# Patient Record
Sex: Female | Born: 1935 | Race: White | Hispanic: No | Marital: Married | Smoking: Never smoker
Health system: Southern US, Community
[De-identification: ages and names within clinical notes are randomized; demographics above are authoritative.]

## PROBLEM LIST (undated history)

## (undated) DIAGNOSIS — IMO0002 Reserved for concepts with insufficient information to code with codable children: Secondary | ICD-10-CM

## (undated) DIAGNOSIS — I10 Essential (primary) hypertension: Secondary | ICD-10-CM

## (undated) DIAGNOSIS — C801 Malignant (primary) neoplasm, unspecified: Secondary | ICD-10-CM

## (undated) HISTORY — PX: MASTECTOMY: SHX3

## (undated) HISTORY — PX: HIP ARTHROPLASTY: SHX981

## (undated) HISTORY — PX: CHOLECYSTECTOMY: SHX55

## (undated) HISTORY — PX: ABDOMINAL HYSTERECTOMY: SHX81

---

## 2007-12-02 ENCOUNTER — Inpatient Hospital Stay (HOSPITAL_COMMUNITY): Admission: EM | Admit: 2007-12-02 | Discharge: 2007-12-06 | Payer: Self-pay | Admitting: Emergency Medicine

## 2007-12-03 ENCOUNTER — Ambulatory Visit: Payer: Self-pay | Admitting: Physical Medicine & Rehabilitation

## 2007-12-06 ENCOUNTER — Inpatient Hospital Stay: Admission: AD | Admit: 2007-12-06 | Discharge: 2008-02-12 | Payer: Self-pay | Admitting: Internal Medicine

## 2007-12-17 ENCOUNTER — Ambulatory Visit (HOSPITAL_COMMUNITY): Admission: RE | Admit: 2007-12-17 | Discharge: 2007-12-17 | Payer: Self-pay | Admitting: Internal Medicine

## 2008-03-10 ENCOUNTER — Encounter (HOSPITAL_COMMUNITY): Admission: RE | Admit: 2008-03-10 | Discharge: 2008-03-24 | Payer: Self-pay | Admitting: Oncology

## 2008-03-10 ENCOUNTER — Ambulatory Visit (HOSPITAL_COMMUNITY): Payer: Self-pay | Admitting: Family Medicine

## 2008-10-17 ENCOUNTER — Encounter (HOSPITAL_COMMUNITY): Admission: RE | Admit: 2008-10-17 | Discharge: 2008-11-16 | Payer: Self-pay | Admitting: Oncology

## 2010-02-18 IMAGING — CT CT CHEST W/O CM
1 series · 15 of 31 positions shown, 19 images · non-contrast
Comparison: None
Correlation:  Radionuclide bone scan 10/17/2008, chest radiographs
10/17/2008

CLINICAL DATA: Abnormal bone scan showing abnormal uptake at
sternum and medial right clavicle near sternoclavicular joint

CT CHEST WITHOUT CONTRAST
TECHNIQUE: Multidetector CT imaging of the chest was performed
following the standard protocol without IV contrast.

[Series 2: chestroutine 5.0 b40f · axial · 0.56mm/px · z∈[+900,+1150]mm · 15 of 56 slices shown, 19 images]
[im 3/56  mediastinal]
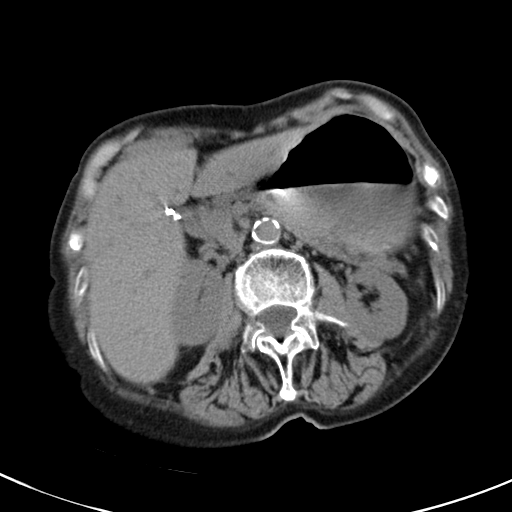
[im 3/56  lung]
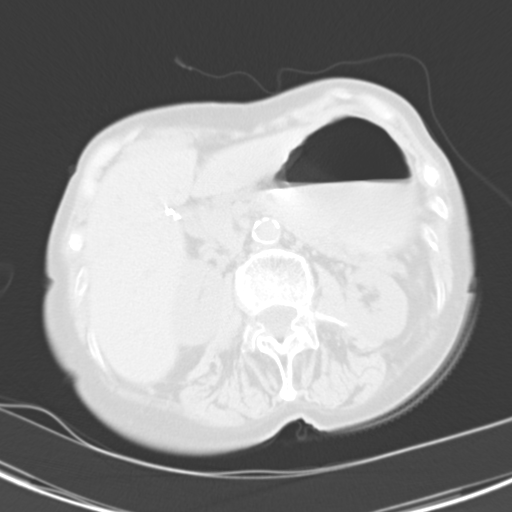
[im 7/56  lung]
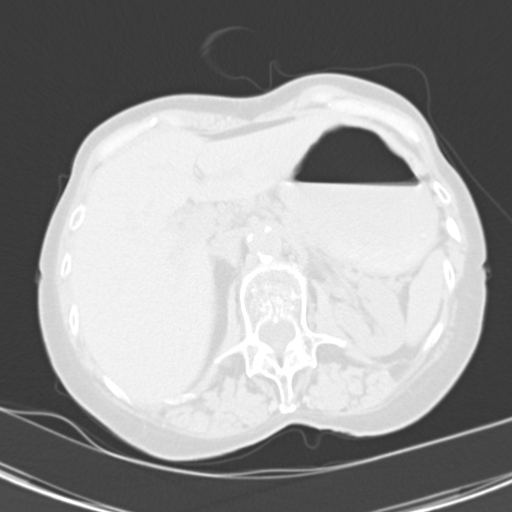
[im 11/56  lung]
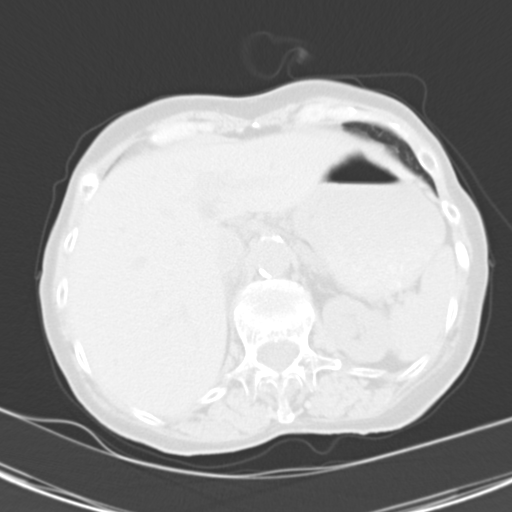
[im 13/56  lung]
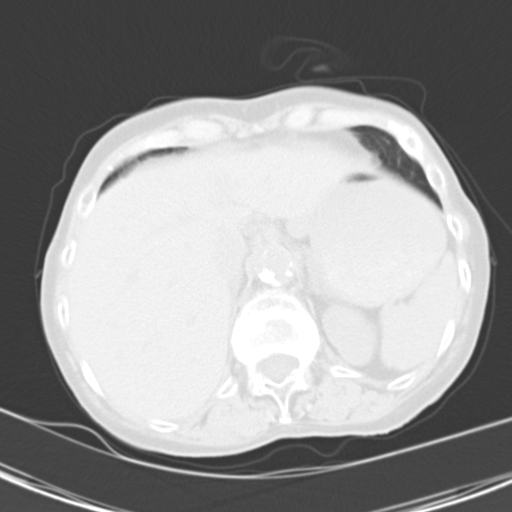
[im 17/56  mediastinal]
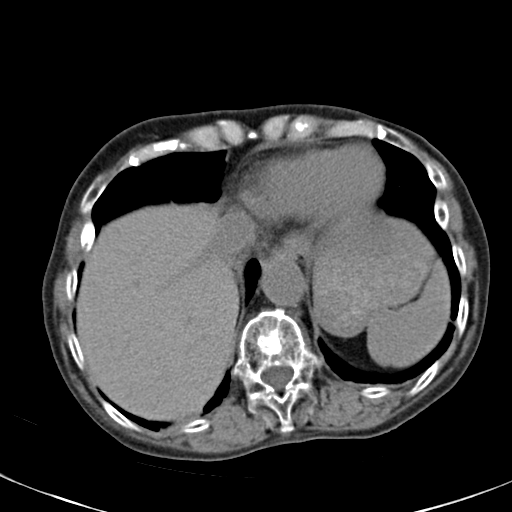
[im 17/56  lung]
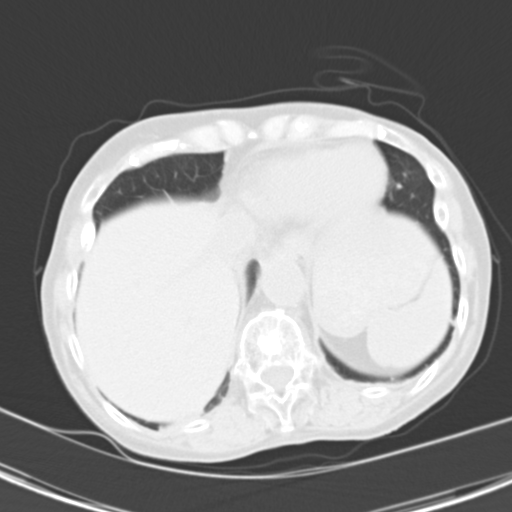
[im 21/56  lung]
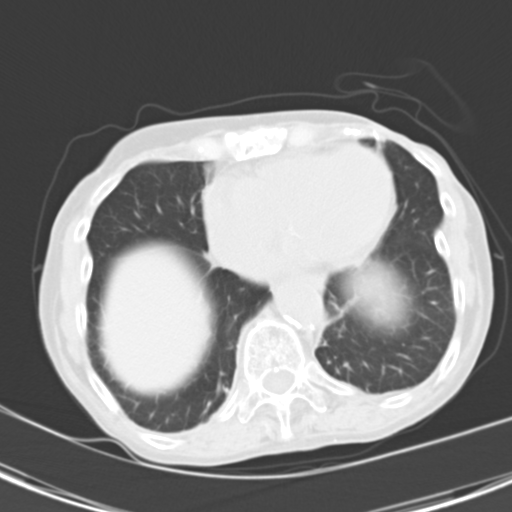
[im 25/56  lung]
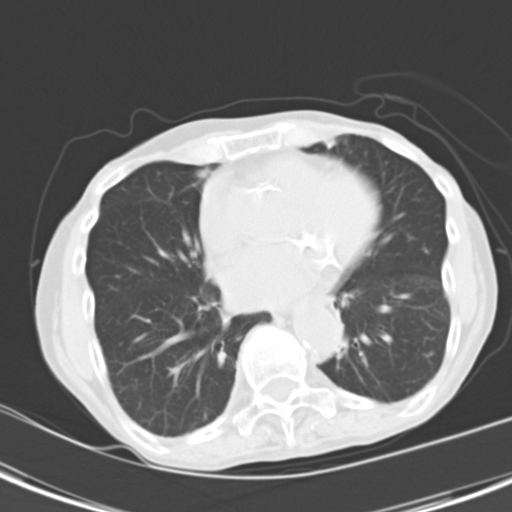
[im 29/56  lung]
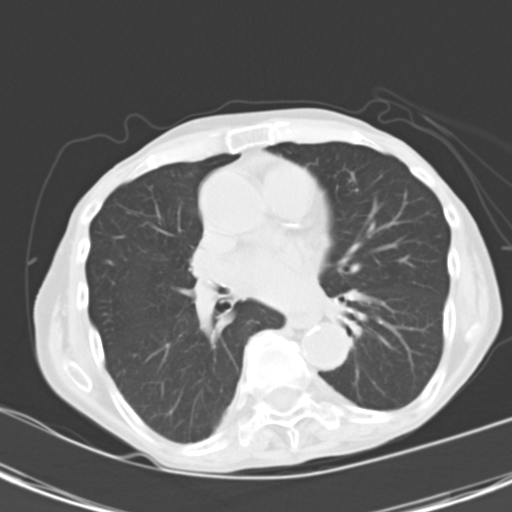
[im 31/56  mediastinal]
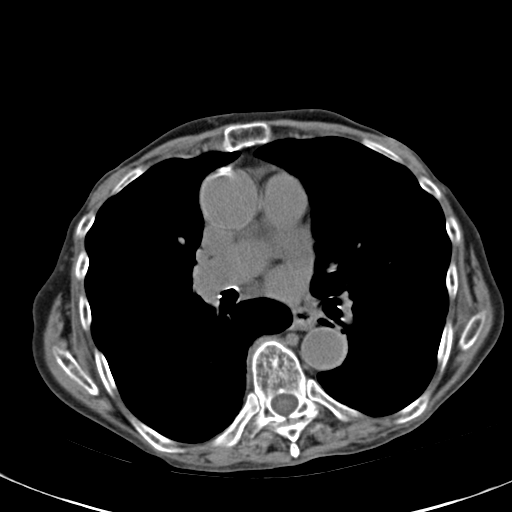
[im 31/56  lung]
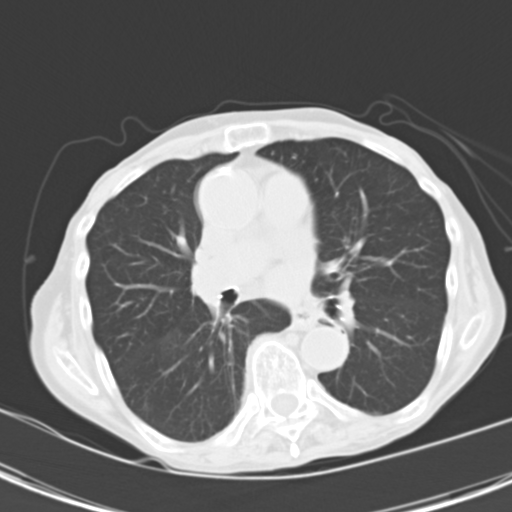
[im 34/56  lung]
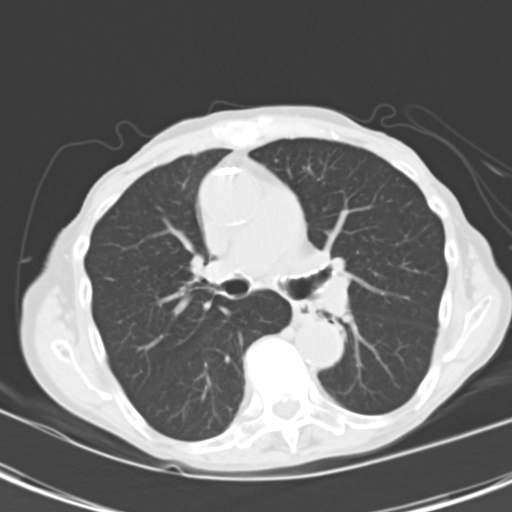
[im 37/56  lung]
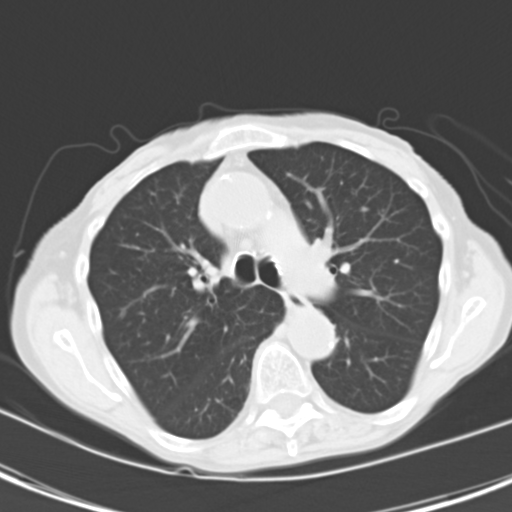
[im 41/56  lung]
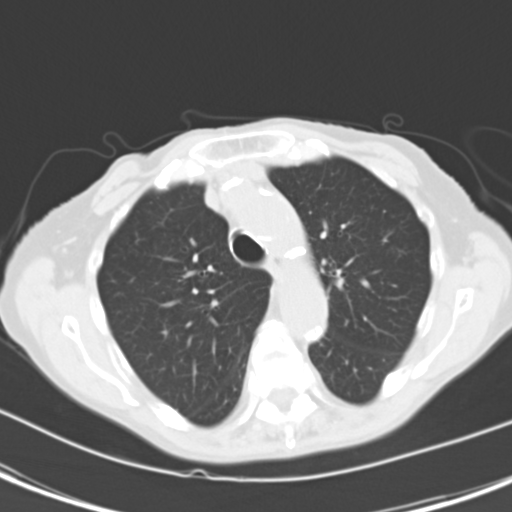
[im 45/56  mediastinal]
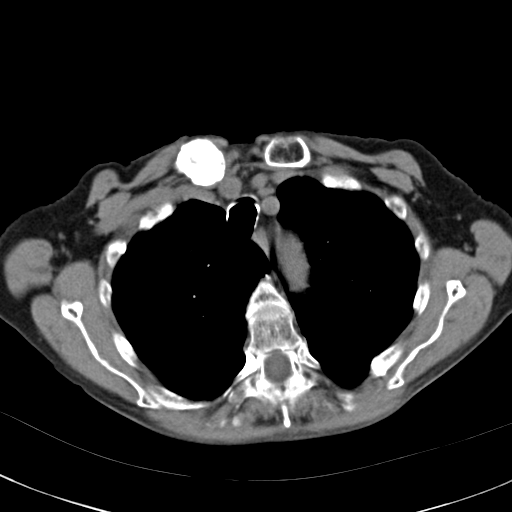
[im 45/56  lung]
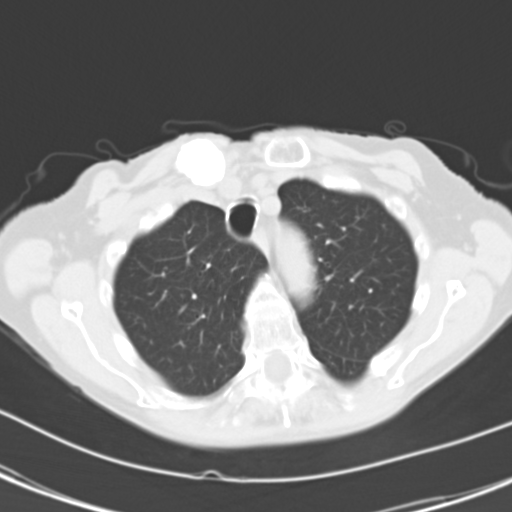
[im 49/56  lung]
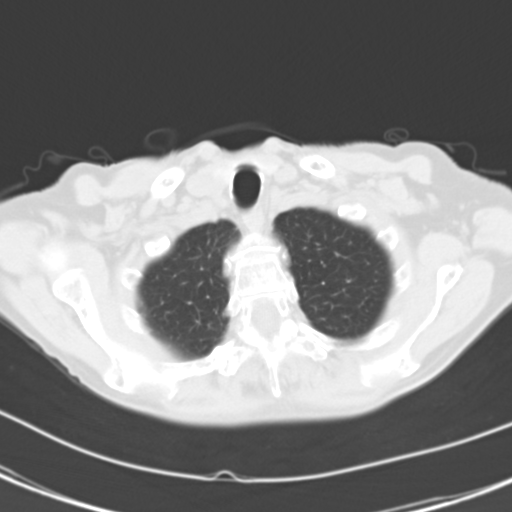
[im 53/56  lung]
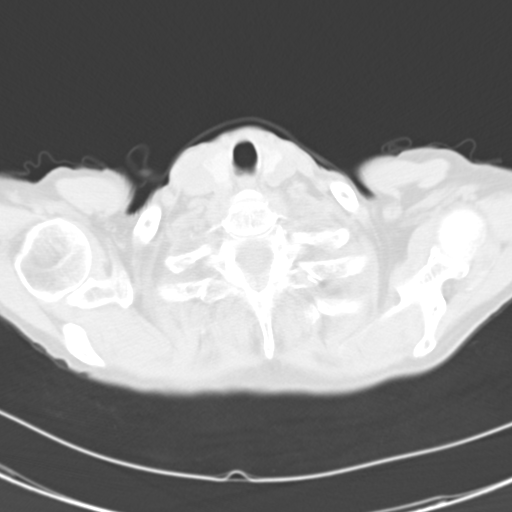

[15 of 31 positions shown; findings below may reference images not displayed]

FINDINGS: Extensive atherosclerotic calcifications of aorta without
aneurysmal dilatation.
Scattered coronary arterial and mitral annular calcification.
Status post cholecystectomy.
Visualized portion of upper abdomen unremarkable.
Emphysematous changes with minimal atelectasis at lung bases.
No pulmonary infiltrate, pleural effusion, or pulmonary
mass/nodule.
Diffuse bony demineralization.
Marked compression deformities of multiple thoracic vertebrae,
including T5, T6, T8, superior endplate T12, and L1.
Healing fracture identified at medial right clavicle, accounting
for bone scan finding.
Slight deformity of sclerosis noted at inferior aspect of sternum,
suggest additional healing fracture, also at site of bone scan
abnormality.
IMPRESSION: COPD.
Osteoporosis with numerous thoracic and upper lumbar spine
compression deformities.
Healing fractures at medial right clavicle and likely at distal
sternum, accounting for bone scan findings.

## 2010-07-18 ENCOUNTER — Encounter: Payer: Self-pay | Admitting: Emergency Medicine

## 2010-11-09 NOTE — Op Note (Signed)
Leah Raymond, Leah Raymond                  ACCOUNT NO.:  [id number]   MEDICAL RECORD NO.:  [id number]          PATIENT TYPE:  INP   LOCATION:  2550                         FACILITY:  [facility]   PHYSICIAN:  Lubertha Basque. Dalldorf, M.D.DATE OF BIRTH:  [birth year]   DATE OF PROCEDURE:  12/02/2007  DATE OF DISCHARGE:                               OPERATIVE REPORT   PREOPERATIVE DIAGNOSIS:  Bilateral subtrochanteric and intertrochanteric  fractures.   POSTOPERATIVE DIAGNOSIS:  Bilateral subtrochanteric and  intertrochanteric fractures.   PROCEDURE:  1. Right hip trochanteric nail.  2. Left hip trochanteric nail.   ANESTHESIA:  General.   ATTENDING SURGEON:  Lubertha Basque. Jerl Santos, MD   ASSISTANT:  Phineas Semen, PA   INDICATIONS FOR PROCEDURE:  The patient is a 77 year old woman with a  history of osteoporosis taking Actonel.  She was walking in her home  today and felt both hips pop.  She fell to the ground and could not get  up.  She was taken to [facility] where she was evaluated.  There was no  orthopedic coverage and she was subsequently transferred to [facility] for definitive management.  Orthopedics was consulted by the  EDP for management.  She is offered ORIF in hopes of her being able to  sit, stand, and potentially walk again.  Informed operative consent was  obtained after discussion of possible complications of reaction to  anesthesia, infection, DVT, PE, and death.   SUMMARY/FINDINGS/PROCEDURE:  Under general anesthesia, both hips were  addressed, right hip first.  She was placed on a fracture table.  The  reductions were very difficult as they were subtrochanteric in nature.  On the right side, we passed the guidewire but then had to open the  fracture site to reduce.  We then placed a 280-mm length Stryker gamma  nail.  This was locked proximally with one large screw and distally with  one screw in the sliding hole.  We then performed a similar procedure on  the left.   The reduction here was more difficult and this was a very  comminuted fracture, especially proximal.  We stabilized it with  identical hardware, though the proximal screw here was slightly longer.  I used fluoroscopy throughout the case to make appropriate  intraoperative decisions and read all these views myself.  Phineas Semen assisted throughout and was invaluable to the completion of the  case and in that he helped position and retract and maintain reduction  while I performed the procedure.  He also closed simultaneously to help  minimize the OR time.   DESCRIPTION OF PROCEDURE:  The patient was taken to the operating suite  where general anesthetic was applied without difficulty.  She was  positioned on the fracture table with the right leg in traction.  The  left leg was dropped down low.  We reduced her fracture but could not  reduce it on the lateral view.  She was prepped and draped in normal  sterile fashion.  After administration of IV Kefzol, a small incision  was made above the greater trochanter.  Dissection was carried down to  the greater trochanter.  A guidewire was placed through the proximal and  distal fragments and was seen to be intraosseous on 2 views distally.  We then reamed appropriately and placed the 280 trochanteric nail.  This  was then secured proximally with locking screw which we locked in a  static fashion with additional locking screw down the shaft of the  device.  We then removed traction.  The fracture site was reduced with  no distraction and I placed a single locking screw freehand technique  through a separate stab wound distally.  This measured 50 mm in length.  The wounds were irrigated followed by reapproximation of deep tissues  with 0 Vicryl and subcutaneous with 2-0 undyed Vicryl.  Skin was closed  with staples.  Sterile gauze dressings were applied here.  She was then  prepped again on the opposite leg and re-draped.  The whole team  changed  gloves and re-scrubbed.  We then addressed the left hip in a similar  fashion, though the comminution proximal was extreme.  I had to perform  an open reduction here and eventually did pass the guidewire and placed  an almost identical gamma nail hardware by Stryker.  Fluoroscopy was  used throughout the case to make appropriate intraoperative decisions  and I read all these views myself.  Again on this side, the wound was  irrigated and deep tissues were reapproximated with Vicryl and skin with  staples.  A dry gauze dressing was applied.  Estimated blood loss was  600 mL total and intraoperative fluids can be obtained from anesthesia  records.   DISPOSITION:  The patient was extubated in the operating room and taken  to recovery room in stable condition.  She was admitted to the  orthopedic surgery service for appropriate postop care to include  perioperative antibiotics and Coumadin plus Lovenox for DVT prophylaxis.  She will also receive a transfusion and this was actually started during  the case.      Lubertha Basque Jerl Santos, M.D.  Electronically Signed     PGD/MEDQ  D:  12/02/2007  T:  12/03/2007  Job:  [id number]

## 2010-11-09 NOTE — Discharge Summary (Signed)
Leah Raymond, Leah Raymond                  ACCOUNT NO.:  [id number]   MEDICAL RECORD NO.:  [id number]          PATIENT TYPE:  INP   LOCATION:  5031                         FACILITY:  [facility]   PHYSICIAN:  Lubertha Basque. Dalldorf, M.D.DATE OF BIRTH:  February 19, 1931   DATE OF ADMISSION:  12/02/2007  DATE OF DISCHARGE:  12/06/2007                               DISCHARGE SUMMARY   ADMITTING DIAGNOSES:  1. Bilateral subtrochanteric intertrochanteric hip fractures.  2. History of hypertension.   DISCHARGE DIAGNOSES:  1. Bilateral subtrochanteric intertrochanteric hip fractures.  2. History of hypertension.  3. With blood loss anemia.   BRIEF HISTORY:  Leah Raymond is a 77 year old white female who had fallen  the day of admission to the hospital and had fractured both her hips on  x-ray.  She obviously was unable to bear weight and was in severe pain.  X-rays were taken which revealed a subtroch intertroch fracture of both  of her hips and she was transported to [facility] at which time  the patient was evaluated by Dr. Marcene Corning and treatment options  discussed with patient and family members and taken to the operating  room for appropriate ORIF.   PERTINENT LAB AND X-RAY FINDINGS:  Two units of packed RBCs were  replaced as necessary after and for blood loss anemia.  Her hemoglobin  was 10.9, hematocrit 31.6, WBC 11.4, platelets 171 on discharge, sodium  140, potassium 3.9, BUN 7, creatinine 0.75, last INR she was on low-dose  Coumadin protocol was 2.9.  Radiology, there were bilateral hip  fractures as stated above noted on preoperative films and then during C-  arm use during the ORIF.   POSTOPERATIVE CARE AND HOSPITAL COURSE:  She was kept on her home  medicines which will be outlined at the end of this dictation.  She also  was placed on a variety of p.o. and IM analgesics for pain, IV Ancef 1  gram q.8 h x3 doses.  Foley catheter was used.  Anticoagulation with  Coumadin and Lovenox  per pharmacy and DVT prophylaxis protocol, and then  appropriate pain medications, antiemetics, Foley catheter, knee high  TEDs, incentive spirometry for additional care in the hospital.  The  first day of postop her vital signs were stable.  Blood pressure was  normal.  She was eating and using a Foley catheter.  Dressings had  minimal drainage.  She had good neurovascular status to her lower  extremities and with the help of physical therapy she could out of bed,  weightbearing on the right side, touchdown weightbearing only on the  left side.  Second day postop her wounds were noted to be benign.  No  sign of infection or irritation.  Her dressings were changed with new  Mepilex dressings applied.  Her blood count did drop a little during her  hospital stay and it was necessary to give her 2 units of packed RBCs.  They are from the [place] area and a bed became available  at the [facility] by [place] and we felt the patient was stable  enough  to be transferred for skilled nursing care there.   CONDITION ON DISCHARGE:  Improved.   FOLLOWUP:  She will remain on a low sodium and heart healthy diet.  May  change the dressings on her hips on an every other day basis.  Any sign  of infection including redness, drainage to call our office and we would  be happy to see her immediately.  She can be weightbearing as tolerated  on the right side and touchdown weightbearing on the left side with the  aid of a therapist or aide and walker.  She will return to see Dr.  Jerl Santos in 10-20 days but her staples could be removed from all sites  at 2 weeks plus.  She will remain on Coumadin for 4 weeks total with an  INR between 2 and 3 regulated by pharmacy protocol.  She will be on the  following medications:   DISCHARGE MEDICATIONS:  1. Colace 100 mg one p.o. b.i.d.  2. Senokot 1 tablet as needed for constipation.  3. Pharmacy protocol for Coumadin.  4. Norvasc 5 mg one a day.   5. Alprazolam 0.5 b.i.d.  6. Temazepam 30 mg at bedtime p.r.n.  7. Calcium once a day 500 mg or 1000 mg.  8. Multivitamin one a day.  9. Actonel 70 mg on Mondays.  10.Vicodin one or two q.4-6 h p.r.n. pain.  11.Acetaminophen 325 one or two q.4-6 h p.r.n. pain or temperature      elevation above 100.1.  12.Enema as needed.  13.Heat to her shoulder right side as needed.  14.Ice to her hips as needed.      Lindwood Qua, P.A.      Lubertha Basque Jerl Santos, M.D.  Electronically Signed    MC/MEDQ  D:  12/06/2007  T:  12/06/2007  Job:  [id number]   cc:   Lubertha Basque. Jerl Santos, M.D.

## 2011-01-08 ENCOUNTER — Emergency Department (HOSPITAL_COMMUNITY): Payer: Medicare Other

## 2011-01-08 ENCOUNTER — Emergency Department (HOSPITAL_COMMUNITY)
Admission: EM | Admit: 2011-01-08 | Discharge: 2011-01-08 | Disposition: A | Discharge status: Home / Self Care | Payer: Medicare Other | Attending: Emergency Medicine | Admitting: Emergency Medicine

## 2011-01-08 DIAGNOSIS — M549 Dorsalgia, unspecified: Secondary | ICD-10-CM | POA: Insufficient documentation

## 2011-01-08 DIAGNOSIS — S6990XA Unspecified injury of unspecified wrist, hand and finger(s), initial encounter: Secondary | ICD-10-CM | POA: Insufficient documentation

## 2011-01-08 DIAGNOSIS — S20219A Contusion of unspecified front wall of thorax, initial encounter: Secondary | ICD-10-CM

## 2011-01-08 DIAGNOSIS — M79609 Pain in unspecified limb: Secondary | ICD-10-CM | POA: Insufficient documentation

## 2011-01-08 DIAGNOSIS — R079 Chest pain, unspecified: Secondary | ICD-10-CM | POA: Insufficient documentation

## 2011-01-08 DIAGNOSIS — W1789XA Other fall from one level to another, initial encounter: Secondary | ICD-10-CM | POA: Insufficient documentation

## 2011-01-08 DIAGNOSIS — S59909A Unspecified injury of unspecified elbow, initial encounter: Secondary | ICD-10-CM | POA: Insufficient documentation

## 2011-01-08 HISTORY — DX: Malignant (primary) neoplasm, unspecified: C80.1

## 2011-01-08 HISTORY — DX: Reserved for concepts with insufficient information to code with codable children: IMO0002

## 2011-01-08 HISTORY — DX: Essential (primary) hypertension: I10

## 2011-01-08 MED ORDER — HYDROCODONE-ACETAMINOPHEN 5-500 MG PO TABS: 1.0000 | ORAL_TABLET | Freq: Four times a day (QID) | ORAL | Status: AC | PRN

## 2011-01-08 NOTE — ED Provider Notes (Signed)
History     Chief Complaint  Patient presents with  . Fall  . Chest Pain    RIB pain  . Back Pain  . Arm Injury  . Hand Pain   Patient is a 77 y.o. female presenting with fall, arm injury, and hand pain. The history is provided by the patient. No language interpreter was used.  Fall The accident occurred 6 to 12 hours ago. She fell from a height of 1 to 2 ft. Impact surface: fell into a door jam with left side. There was no blood loss. The point of impact was the left shoulder (leb ribs and humerus). The pain is present in the left shoulder (left humerus and ribs). The pain is at a severity of 10/10. The pain is severe. She was ambulatory at the scene. There was no entrapment after the fall. There was no drug use involved in the accident. There was no alcohol use involved in the accident. Pertinent negatives include no numbness, no abdominal pain, no nausea, no vomiting, no hematuria and no headaches. The symptoms are aggravated by activity. Prehospitalization: none. She has tried nothing for the symptoms. The treatment provided no relief.  Arm Injury  The incident occurred today. The injury mechanism was a fall. The wounds were not self-inflicted. No protective equipment was used. She came to the ER via personal transport. There is an injury to the left upper arm and left shoulder. The pain is severe. It is unlikely that a foreign body is present. Pertinent negatives include no chest pain, no fussiness, no numbness, no visual disturbance, no abdominal pain, no nausea, no vomiting, no bladder incontinence, no headaches, no hearing loss, no inability to bear weight, no neck pain, no pain when bearing weight, no cough and no difficulty breathing.  Hand Pain Pertinent negatives include no chest pain, no abdominal pain and no headaches.    Past Medical History  Diagnosis Date  . Osteoporosis   . Spinal fracture   . Hypertension   . Cancer     Past Surgical History  Procedure Date  .  Abdominal hysterectomy   . Cholecystectomy   . Hip arthroplasty   . Mastectomy     History reviewed. No pertinent family history.  History  Substance Use Topics  . Smoking status: Never Smoker   . Smokeless tobacco: Not on file  . Alcohol Use: No    OB History    Grav Para Term Preterm Abortions TAB SAB Ect Mult Living                  Review of Systems  HENT: Negative for hearing loss and neck pain.   Eyes: Negative for visual disturbance.  Respiratory: Negative for cough.   Cardiovascular: Negative for chest pain.  Gastrointestinal: Negative for nausea, vomiting and abdominal pain.  Genitourinary: Negative for bladder incontinence and hematuria.  Musculoskeletal: Negative for arthralgias.  Skin: Negative.   Neurological: Negative for numbness and headaches.  Psychiatric/Behavioral: Negative.     Physical Exam  BP 136/82  Pulse 105  Temp(Src) 98.2 F (36.8 C) (Oral)  Resp 16  Ht 5' (1.524 m)  Wt 103 lb (46.72 kg)  BMI 20.12 kg/m2  SpO2 97%  Physical Exam  Constitutional: She is oriented to person, place, and time. She appears well-developed and well-nourished. No distress.  HENT:  Head: Normocephalic and atraumatic.  Mouth/Throat: No oropharyngeal exudate.  Eyes: EOM are normal. Pupils are equal, round, and reactive to light.  Neck: Normal range  of motion. Neck supple.  Cardiovascular: Normal rate and regular rhythm.  Exam reveals no friction rub.   No murmur heard. Pulmonary/Chest: Effort normal and breath sounds normal. No respiratory distress. She has no wheezes. She has no rales.  Abdominal: Soft. Bowel sounds are normal. There is no tenderness. There is no rebound and no guarding.  Musculoskeletal:       Left shoulder: She exhibits tenderness and pain. She exhibits normal range of motion, no bony tenderness, no swelling, no effusion, no crepitus, no deformity, no laceration, no spasm, normal pulse and normal strength.       Left upper arm: She exhibits  no tenderness, no bony tenderness, no swelling, no edema, no deformity and no laceration.       FROM of the entire LUE, no snuff box tenderness and neurovascularly intact left hand cap refill in fingers < 2 sec  Neurological: She is alert and oriented to person, place, and time. She displays normal reflexes.  Skin: Skin is warm and dry. She is not diaphoretic.  Psychiatric: She has a normal mood and affect.    ED Course  Procedures  MDM       Lennix Rotundo K Donta Mcinroy-Rasch, MD 01/08/11 [phone]

## 2011-01-08 NOTE — ED Notes (Signed)
Pt given warm blankets,

## 2011-01-08 NOTE — ED Notes (Signed)
Pt presents with left sided rib, back, arm, hand, and scapular pain after loosing balance last night and falling into wooden doorway.

## 2011-01-08 NOTE — ED Notes (Signed)
Pt states that she ran into the doorframe during the middle of the night while attempting to go to the restroom, pt c/o pain to left scapula, left shoulder, left upper arm, left chest and left rib pain, pain increases with any movement and pressure, cms intact to left arm, lung sounds even bilateral,

## 2011-01-08 NOTE — ED Notes (Signed)
Pt does have small pinpoint size abrasion to the edge of the left scapula,

## 2011-01-08 NOTE — ED Notes (Signed)
Returned from xray

## 2011-03-24 LAB — PREPARE RBC (CROSSMATCH)

## 2011-03-24 LAB — CBC
HCT: 24.7 — ABNORMAL LOW
HCT: 32.8 — ABNORMAL LOW
Hemoglobin: 10.9 — ABNORMAL LOW
Hemoglobin: 7.8 — CL
Hemoglobin: 8.5 — ABNORMAL LOW
MCHC: 33.8
MCHC: 34.5
MCHC: 34.7
MCHC: 35.9
Platelets: 116 — ABNORMAL LOW
Platelets: 142 — ABNORMAL LOW
RBC: 2.52 — ABNORMAL LOW
RBC: 3.21 — ABNORMAL LOW
RBC: 3.49 — ABNORMAL LOW
RDW: 14.8
WBC: 10.5
WBC: 11 — ABNORMAL HIGH
WBC: 11.4 — ABNORMAL HIGH
WBC: 21.5 — ABNORMAL HIGH

## 2011-03-24 LAB — CROSSMATCH: ABO/RH(D): A POS

## 2011-03-24 LAB — POCT I-STAT 4, (NA,K, GLUC, HGB,HCT)
Glucose, Bld: 157 — ABNORMAL HIGH
HCT: 19 — ABNORMAL LOW

## 2011-03-24 LAB — BASIC METABOLIC PANEL
BUN: 9
CO2: 28
CO2: 30
Calcium: 7.3 — ABNORMAL LOW
Calcium: 7.7 — ABNORMAL LOW
GFR calc Af Amer: >60
GFR calc non Af Amer: 57 — ABNORMAL LOW
GFR calc non Af Amer: >60
Glucose, Bld: 162 — ABNORMAL HIGH
Potassium: 3.5
Potassium: 3.9
Sodium: 137
Sodium: 140

## 2011-03-24 LAB — PROTIME-INR
INR: 1.2
INR: 2.7 — ABNORMAL HIGH
INR: 2.9 — ABNORMAL HIGH
Prothrombin Time: 14.9
Prothrombin Time: 15.5 — ABNORMAL HIGH

## 2011-03-24 LAB — COMPREHENSIVE METABOLIC PANEL
ALT: 13
AST: 23
CO2: 29
Calcium: 8.8
Chloride: 106
GFR calc Af Amer: >60
GFR calc non Af Amer: 58 — ABNORMAL LOW
Potassium: 3.4 — ABNORMAL LOW
Sodium: 139

## 2011-03-24 LAB — SAMPLE TO BLOOD BANK

## 2011-03-24 LAB — DIFFERENTIAL
Eosinophils Absolute: 0.5
Eosinophils Relative: 2
Lymphs Abs: 0.8

## 2011-03-24 LAB — HEMOGLOBIN AND HEMATOCRIT, BLOOD: Hemoglobin: 11.6 — ABNORMAL LOW

## 2011-09-26 ENCOUNTER — Other Ambulatory Visit (HOSPITAL_COMMUNITY): Payer: Self-pay | Admitting: Orthopaedic Surgery

## 2011-09-26 DIAGNOSIS — M4850XA Collapsed vertebra, not elsewhere classified, site unspecified, initial encounter for fracture: Secondary | ICD-10-CM

## 2011-09-28 ENCOUNTER — Encounter (HOSPITAL_COMMUNITY)
Admission: RE | Admit: 2011-09-28 | Discharge: 2011-09-28 | Disposition: A | Discharge status: Home / Self Care | Payer: Medicare Other | Source: Ambulatory Visit | Attending: Orthopaedic Surgery | Admitting: Orthopaedic Surgery

## 2011-09-28 ENCOUNTER — Encounter (HOSPITAL_COMMUNITY): Payer: Self-pay

## 2011-09-28 DIAGNOSIS — M4850XA Collapsed vertebra, not elsewhere classified, site unspecified, initial encounter for fracture: Secondary | ICD-10-CM

## 2011-09-28 DIAGNOSIS — M8448XA Pathological fracture, other site, initial encounter for fracture: Secondary | ICD-10-CM | POA: Insufficient documentation

## 2011-09-28 MED ORDER — TECHNETIUM TC 99M MEDRONATE IV KIT
25.0000 | PACK | Freq: Once | INTRAVENOUS | Status: AC | PRN
  Administered 2011-09-28: 24 via INTRAVENOUS

## 2013-08-06 ENCOUNTER — Encounter (HOSPITAL_COMMUNITY): Payer: Self-pay | Admitting: Emergency Medicine

## 2013-08-06 ENCOUNTER — Observation Stay (HOSPITAL_COMMUNITY)
Admission: EM | Admit: 2013-08-06 | Discharge: 2013-08-09 | Disposition: A | Discharge status: Home / Self Care | Payer: Medicare Other | Attending: Family Medicine | Admitting: Family Medicine

## 2013-08-06 ENCOUNTER — Emergency Department (HOSPITAL_COMMUNITY): Payer: Medicare Other

## 2013-08-06 DIAGNOSIS — Y92009 Unspecified place in unspecified non-institutional (private) residence as the place of occurrence of the external cause: Secondary | ICD-10-CM | POA: Insufficient documentation

## 2013-08-06 DIAGNOSIS — W19XXXA Unspecified fall, initial encounter: Secondary | ICD-10-CM | POA: Insufficient documentation

## 2013-08-06 DIAGNOSIS — S32009A Unspecified fracture of unspecified lumbar vertebra, initial encounter for closed fracture: Principal | ICD-10-CM | POA: Insufficient documentation

## 2013-08-06 DIAGNOSIS — I10 Essential (primary) hypertension: Secondary | ICD-10-CM | POA: Diagnosis present

## 2013-08-06 DIAGNOSIS — G8929 Other chronic pain: Secondary | ICD-10-CM | POA: Insufficient documentation

## 2013-08-06 DIAGNOSIS — Z8781 Personal history of (healed) traumatic fracture: Secondary | ICD-10-CM

## 2013-08-06 DIAGNOSIS — Z87311 Personal history of (healed) other pathological fracture: Secondary | ICD-10-CM | POA: Insufficient documentation

## 2013-08-06 DIAGNOSIS — M549 Dorsalgia, unspecified: Secondary | ICD-10-CM | POA: Diagnosis present

## 2013-08-06 DIAGNOSIS — S32000A Wedge compression fracture of unspecified lumbar vertebra, initial encounter for closed fracture: Secondary | ICD-10-CM | POA: Diagnosis present

## 2013-08-06 DIAGNOSIS — M810 Age-related osteoporosis without current pathological fracture: Secondary | ICD-10-CM | POA: Diagnosis present

## 2013-08-06 NOTE — ED Notes (Addendum)
Pt to department via [facility].  Pt reports that she was trying to sit in a chair, ended up sitting on the arm and then fell.  States that she has pain in right leg which is new.  Pt has chronic back pain, denies any new pain in back.  Pt received 25  mcg of fentanyl in ambulance at 2225 and another 25 mcg at 2255.

## 2013-08-06 NOTE — ED Provider Notes (Signed)
CSN: [id number]     Arrival date & time 08/06/13  2308 History  This chart was scribed for Veryl Speak, MD by Roxan Diesel, ED scribe.  This patient was seen in room APA08/APA08 and the patient's care was started at 11:20 PM.   Chief Complaint  Patient presents with  . Fall    The history is provided by the patient. No language interpreter was used.    HPI Comments: Leah Raymond is a 80 y.o. female with h/o osteoporosis and spinal fracture brought in by ambulance to the Emergency Department complaining of a fall that occurred this morning.  Pt was trying to sit in a chair but accidentally sat onto the arm and fell onto the seat.  Since then she has had pain in her right hip and lower back.  However she has been able to walk since the fall and states she was "walking all day" today with her walker.  She denies abdominal pain.  Pt has h/o bilateral hip fracture several years ago but has not had issues with her hips since then and is normally able to walk without assistance.  She wears a back brace at all times and was wearing one when she fell.   Past Medical History  Diagnosis Date  . Osteoporosis   . Spinal fracture   . Hypertension   . Cancer     Past Surgical History  Procedure Laterality Date  . Abdominal hysterectomy    . Cholecystectomy    . Hip arthroplasty    . Mastectomy      History reviewed. No pertinent family history.   History  Substance Use Topics  . Smoking status: Never Smoker   . Smokeless tobacco: Not on file  . Alcohol Use: No    OB History   Grav Para Term Preterm Abortions TAB SAB Ect Mult Living                   Review of Systems A complete 10 system review of systems was obtained and all systems are negative except as noted in the HPI and PMH.     Allergies  Penicillins and Morphine and related  Home Medications   Current Outpatient Rx  Name  Route  Sig  Dispense  Refill  . ALPRAZolam (XANAX) 0.5 MG tablet   Oral   Take 0.5 mg  by mouth 2 (two) times daily.           Marland Kitchen amitriptyline (ELAVIL) 10 MG tablet   Oral   Take 10 mg by mouth at bedtime.           Marland Kitchen amLODipine (NORVASC) 5 MG tablet   Oral   Take 5 mg by mouth daily.           Marland Kitchen HYDROcodone-acetaminophen (NORCO) 7.5-325 MG per tablet   Oral   Take 1 tablet by mouth every 6 (six) hours as needed.           . Multiple Vitamin (MULTIVITAMIN) capsule   Oral   Take 1 capsule by mouth daily.           . temazepam (RESTORIL) 22.5 MG capsule   Oral   Take 22.5 mg by mouth at bedtime as needed.            BP 151/72  Pulse 88  Temp(Src) 98.5 F (36.9 C) (Oral)  Resp 20  Ht 4\' 10"  (1.473 m)  Wt 103 lb (46.72 kg)  BMI 21.53 kg/m2  SpO2 92%  Physical Exam  Nursing note and vitals reviewed. Constitutional: She is oriented to person, place, and time. She appears well-developed and well-nourished. No distress.  HENT:  Head: Normocephalic and atraumatic.  Eyes: EOM are normal.  Neck: Neck supple. No tracheal deviation present.  Cardiovascular: Normal rate.   Pulmonary/Chest: Effort normal. No respiratory distress.  Musculoskeletal:  Tenderness to palpation in the lumbar region.  No bony tenderness.  No step-offs. Right hip appears grossly normal.  No pain with internal or external rotation.  Distal pulses, motor, and sensory are intact.  Neurological: She is alert and oriented to person, place, and time.  Skin: Skin is warm and dry.  Psychiatric: She has a normal mood and affect. Her behavior is normal.    ED Course  Procedures (including critical care time)  DIAGNOSTIC STUDIES: Oxygen Saturation is 92% on room air, low by my interpretation.    COORDINATION OF CARE: 11:26 PM-Discussed treatment plan which includes imaging with pt at bedside and pt agreed to plan.     Labs Review Labs Reviewed - No data to display  Imaging Review No results found.    MDM   Final diagnoses:  None    Patient brought here after a fall  while trying to sit down on a chair. She is complaining of discomfort in her lower back. X-rays reveal a new L2 compression fracture which will be further imaged with a CT scan. Patient is unable to stand and walk, therefore I have consult the hospitalist for admission. He will be the medicine service for pain control, therapy.    I personally performed the services described in this documentation, which was scribed in my presence. The recorded information has been reviewed and is accurate.      Veryl Speak, MD 08/07/13 [phone]

## 2013-08-07 ENCOUNTER — Emergency Department (HOSPITAL_COMMUNITY): Payer: Medicare Other

## 2013-08-07 ENCOUNTER — Encounter (HOSPITAL_COMMUNITY): Payer: Self-pay | Admitting: Internal Medicine

## 2013-08-07 DIAGNOSIS — S32000A Wedge compression fracture of unspecified lumbar vertebra, initial encounter for closed fracture: Secondary | ICD-10-CM | POA: Diagnosis present

## 2013-08-07 DIAGNOSIS — Z8781 Personal history of (healed) traumatic fracture: Secondary | ICD-10-CM

## 2013-08-07 DIAGNOSIS — I10 Essential (primary) hypertension: Secondary | ICD-10-CM | POA: Diagnosis present

## 2013-08-07 DIAGNOSIS — S32009A Unspecified fracture of unspecified lumbar vertebra, initial encounter for closed fracture: Principal | ICD-10-CM

## 2013-08-07 DIAGNOSIS — M810 Age-related osteoporosis without current pathological fracture: Secondary | ICD-10-CM

## 2013-08-07 DIAGNOSIS — M549 Dorsalgia, unspecified: Secondary | ICD-10-CM

## 2013-08-07 LAB — COMPREHENSIVE METABOLIC PANEL
ALBUMIN: 3.6 g/dL (ref 3.5–5.2)
ALT: 15 U/L (ref 0–35)
AST: 22 U/L (ref 0–37)
Alkaline Phosphatase: 95 U/L (ref 39–117)
BUN: 8 mg/dL (ref 6–23)
CALCIUM: 9.1 mg/dL (ref 8.4–10.5)
CO2: 32 mEq/L (ref 19–32)
Chloride: 97 mEq/L (ref 96–112)
Creatinine, Ser: 0.51 mg/dL (ref 0.50–1.10)
GFR calc non Af Amer: 88 mL/min — ABNORMAL LOW (ref >90)
GLUCOSE: 105 mg/dL — AB (ref 70–99)
Potassium: 3.4 mEq/L — ABNORMAL LOW (ref 3.7–5.3)
SODIUM: 138 meq/L (ref 137–147)
TOTAL PROTEIN: 6.9 g/dL (ref 6.0–8.3)
Total Bilirubin: 0.4 mg/dL (ref 0.3–1.2)

## 2013-08-07 LAB — CBC WITH DIFFERENTIAL/PLATELET
BASOS ABS: 0 10*3/uL (ref 0.0–0.1)
BASOS PCT: 0 % (ref 0–1)
EOS ABS: 0.4 10*3/uL (ref 0.0–0.7)
EOS PCT: 4 % (ref 0–5)
HCT: 34.3 % — ABNORMAL LOW (ref 36.0–46.0)
Hemoglobin: 11.7 g/dL — ABNORMAL LOW (ref 12.0–15.0)
Lymphocytes Relative: 9 % — ABNORMAL LOW (ref 12–46)
Lymphs Abs: 0.9 10*3/uL (ref 0.7–4.0)
MCH: 31.6 pg (ref 26.0–34.0)
MCHC: 34.1 g/dL (ref 30.0–36.0)
MCV: 92.7 fL (ref 78.0–100.0)
Monocytes Absolute: 0.9 10*3/uL (ref 0.1–1.0)
Monocytes Relative: 9 % (ref 3–12)
Neutro Abs: 7.9 10*3/uL — ABNORMAL HIGH (ref 1.7–7.7)
Neutrophils Relative %: 78 % — ABNORMAL HIGH (ref 43–77)
PLATELETS: 225 10*3/uL (ref 150–400)
RBC: 3.7 MIL/uL — ABNORMAL LOW (ref 3.87–5.11)
RDW: 12 % (ref 11.5–15.5)
WBC: 10 10*3/uL (ref 4.0–10.5)

## 2013-08-07 LAB — MRSA PCR SCREENING: MRSA by PCR: NEGATIVE

## 2013-08-07 LAB — PROTIME-INR
INR: 0.99 (ref 0.00–1.49)
Prothrombin Time: 12.9 seconds (ref 11.6–15.2)

## 2013-08-07 MED ORDER — ACETAMINOPHEN 650 MG RE SUPP: 650.0000 mg | Freq: Four times a day (QID) | RECTAL | Status: DC | PRN

## 2013-08-07 MED ORDER — ACETAMINOPHEN 325 MG PO TABS: 650.0000 mg | ORAL_TABLET | Freq: Four times a day (QID) | ORAL | Status: DC | PRN

## 2013-08-07 MED ORDER — ONDANSETRON HCL 4 MG/2ML IJ SOLN: 4.0000 mg | Freq: Four times a day (QID) | INTRAMUSCULAR | Status: DC | PRN

## 2013-08-07 MED ORDER — HYDROCODONE-ACETAMINOPHEN 7.5-325 MG/15ML PO SOLN
ORAL | Status: AC
  Administered 2013-08-07: 15 mL via ORAL
  Filled 2013-08-07: qty 15

## 2013-08-07 MED ORDER — MORPHINE SULFATE 2 MG/ML IJ SOLN
INTRAMUSCULAR | Status: AC
  Filled 2013-08-07: qty 1

## 2013-08-07 MED ORDER — ADULT MULTIVITAMIN W/MINERALS CH
1.0000 | ORAL_TABLET | Freq: Every day | ORAL | Status: DC
  Administered 2013-08-07 – 2013-08-09 (×3): 1 via ORAL
  Filled 2013-08-07 (×3): qty 1

## 2013-08-07 MED ORDER — BISACODYL 10 MG RE SUPP: 10.0000 mg | Freq: Every day | RECTAL | Status: DC | PRN

## 2013-08-07 MED ORDER — POLYETHYLENE GLYCOL 3350 17 G PO PACK
17.0000 g | PACK | Freq: Every day | ORAL | Status: DC | PRN
  Filled 2013-08-07: qty 1

## 2013-08-07 MED ORDER — MULTIVITAMINS PO CAPS: 1.0000 | ORAL_CAPSULE | Freq: Every day | ORAL | Status: DC

## 2013-08-07 MED ORDER — CALCIUM CARBONATE-VITAMIN D 500-200 MG-UNIT PO TABS
1.0000 | ORAL_TABLET | Freq: Two times a day (BID) | ORAL | Status: DC
  Administered 2013-08-07 – 2013-08-09 (×5): 1 via ORAL
  Filled 2013-08-07 (×5): qty 1

## 2013-08-07 MED ORDER — DOCUSATE SODIUM 100 MG PO CAPS
100.0000 mg | ORAL_CAPSULE | Freq: Two times a day (BID) | ORAL | Status: DC
  Administered 2013-08-07 – 2013-08-09 (×5): 100 mg via ORAL
  Filled 2013-08-07 (×5): qty 1

## 2013-08-07 MED ORDER — TEMAZEPAM 7.5 MG PO CAPS
22.5000 mg | ORAL_CAPSULE | Freq: Every evening | ORAL | Status: DC | PRN
  Administered 2013-08-07 – 2013-08-08 (×2): 22.5 mg via ORAL
  Filled 2013-08-07 (×4): qty 1

## 2013-08-07 MED ORDER — POTASSIUM CHLORIDE CRYS ER 20 MEQ PO TBCR
40.0000 meq | EXTENDED_RELEASE_TABLET | Freq: Once | ORAL | Status: AC
  Administered 2013-08-07: 40 meq via ORAL

## 2013-08-07 MED ORDER — HYDROMORPHONE HCL PF 1 MG/ML IJ SOLN
0.5000 mg | INTRAMUSCULAR | Status: DC | PRN
  Administered 2013-08-07 – 2013-08-09 (×4): 0.5 mg via INTRAVENOUS
  Filled 2013-08-07 (×5): qty 1

## 2013-08-07 MED ORDER — AMLODIPINE BESYLATE 5 MG PO TABS
5.0000 mg | ORAL_TABLET | Freq: Every day | ORAL | Status: DC
  Administered 2013-08-07 – 2013-08-09 (×3): 5 mg via ORAL
  Filled 2013-08-07 (×3): qty 1

## 2013-08-07 MED ORDER — AMITRIPTYLINE HCL 10 MG PO TABS
10.0000 mg | ORAL_TABLET | Freq: Every day | ORAL | Status: DC
  Administered 2013-08-07 – 2013-08-08 (×2): 10 mg via ORAL
  Filled 2013-08-07 (×2): qty 1

## 2013-08-07 MED ORDER — ONDANSETRON HCL 4 MG PO TABS: 4.0000 mg | ORAL_TABLET | Freq: Four times a day (QID) | ORAL | Status: DC | PRN

## 2013-08-07 MED ORDER — HYDROCODONE-ACETAMINOPHEN 7.5-325 MG PO TABS
1.0000 | ORAL_TABLET | Freq: Four times a day (QID) | ORAL | Status: DC | PRN
  Administered 2013-08-07 – 2013-08-09 (×5): 1 via ORAL
  Filled 2013-08-07 (×5): qty 1

## 2013-08-07 MED ORDER — ENOXAPARIN SODIUM 40 MG/0.4ML ~~LOC~~ SOLN
40.0000 mg | SUBCUTANEOUS | Status: DC
  Administered 2013-08-07: 40 mg via SUBCUTANEOUS
  Filled 2013-08-07: qty 0.4

## 2013-08-07 MED ORDER — ENOXAPARIN SODIUM 30 MG/0.3ML ~~LOC~~ SOLN
30.0000 mg | SUBCUTANEOUS | Status: DC
  Administered 2013-08-08 – 2013-08-09 (×2): 30 mg via SUBCUTANEOUS
  Filled 2013-08-07 (×2): qty 0.3

## 2013-08-07 MED ORDER — SENNA 8.6 MG PO TABS
1.0000 | ORAL_TABLET | Freq: Two times a day (BID) | ORAL | Status: DC
  Administered 2013-08-07 – 2013-08-09 (×4): 8.6 mg via ORAL
  Filled 2013-08-07 (×4): qty 1

## 2013-08-07 MED ORDER — POTASSIUM CHLORIDE CRYS ER 20 MEQ PO TBCR
EXTENDED_RELEASE_TABLET | ORAL | Status: AC
  Administered 2013-08-07: 40 meq via ORAL
  Filled 2013-08-07: qty 2

## 2013-08-07 NOTE — Progress Notes (Signed)
Leah Raymond NID:[id number] DOB: March 29, 1931 DOA: 08/06/2013 PCP: Robert Bellow, MD   Subjective: This 80 year old lady who has osteoporosis was admitted with back pain and has been found to have several compression fractures in her lumbar spine. It looks like L3 is acute. She is in pain.           Physical Exam: Blood pressure 123/59, pulse 90, temperature 98.2 F (36.8 C), temperature source Oral, resp. rate 20, height 4\' 10"  (1.473 m), weight 37 kg (81 lb 9.1 oz), SpO2 95.00%. She looks systemically well. Heart sounds are present and normal. Lung fields are clear. She is alert and oriented. I did not examine her back this morning.   Investigations:  No results found for this or any previous visit (from the past 240 hour(s)).   Basic Metabolic Panel:  Recent Labs  08/07/13 0349  NA 138  K 3.4*  CL 97  CO2 32  GLUCOSE 105*  BUN 8  CREATININE 0.51  CALCIUM 9.1   Liver Function Tests:  Recent Labs  08/07/13 0349  AST 22  ALT 15  ALKPHOS 95  BILITOT 0.4  PROT 6.9  ALBUMIN 3.6     CBC:  Recent Labs  08/07/13 0349  WBC 10.0  NEUTROABS 7.9*  HGB 11.7*  HCT 34.3*  MCV 92.7  PLT 225    Dg Lumbar Spine Complete  08/07/2013   CLINICAL DATA:  Fall, hip tenderness and back pain.  EXAM: LUMBAR SPINE - COMPLETE 4+ VIEW  COMPARISON:  Bone scan September 28, 2011 and CT of the chest October 22, 2008  FINDINGS: Patient is osteopenic which may decrease sensitivity for acute nondisplaced fractures ; severe compression deformities of vertebral body L1, L2, mild compression deformities of L3 and L4, the L2 fracture is new from prior CT. The lower levels were not included on prior CT. No malalignment.  Intervertebral disc heights generally preserved. Mild L5-S1 facet arthropathy. Status post bilateral femoral neck pinning. Phleboliths in the pelvis. Moderate aortoiliac vascular calcifications. Surgical clips in the right abdomen likely reflect cholecystectomy. Moderate  amount of retained large bowel stool.  IMPRESSION: Severe L1 and L2 compression fractures (L2 is new from 2010), mild L3 and L4 compression fractures, not previously imaged. Osteopenia decreases sensitivity for acute nondisplaced fractures. No malalignment.   Electronically Signed   By: Elon Alas   On: 08/07/2013 00:38   Dg Hip Complete Right  08/07/2013   CLINICAL DATA:  Status post fall with hip pain  EXAM: RIGHT HIP - COMPLETE 2+ VIEW  COMPARISON:  None.  FINDINGS: There is no evidence of hip fracture or dislocation. Right femoral rod is identified.  IMPRESSION: No acute fracture or dislocation.   Electronically Signed   By: Abelardo Diesel M.D.   On: 08/07/2013 00:31   Ct Lumbar Spine Wo Contrast  08/07/2013   CLINICAL DATA:  Fall.  New compression fracture.  Back pain.  EXAM: CT LUMBAR SPINE WITHOUT CONTRAST  TECHNIQUE: Multidetector CT imaging of the lumbar spine was performed without intravenous contrast administration. Multiplanar CT image reconstructions were also generated.  COMPARISON:  DG LUMBAR SPINE COMPLETE dated 08/07/2013; CT CHEST W/O CM dated 10/22/2008; DG CHEST 2 VIEW dated 01/08/2011  FINDINGS: Incidental imaging of the lung bases demonstrates scattered areas of scarring and atelectasis. Dense aortic atherosclerosis. Cholecystectomy. Nonobstructing right renal collecting system calculus. Ptosis of the right kidney. Surgical clip present as splenic hilum. Rounded low-density structure with high density material dependently adjacent to the upper pole of  the left kidney probably represents gastric diverticulum. Severe aortoiliac atherosclerosis.  There are 5 lumbar type vertebral bodies. Every lumbar level demonstrates loss of vertebral body height.  T12 shows a shows superior endplate compression fracture with Schmorl's node and mild retropulsion. No significant central canal compromise. Ankylosis with the L1 vertebra on the left.  L1 shows 75% maximal loss of central vertebral body  height. Retropulsion measures 5 mm and produces mild central stenosis.  L2 shows greater than 75% loss vertebral body height and a focal spicule of bone retropulsed in the midline, producing moderate to severe central stenosis. The retropulsed bone almost divides the central canal. No paravertebral phlegmon suggesting this is a chronic injury.  L3 shows a superior endplate compression fracture which is mild, with 25% loss of vertebral body height and minimal retropulsion. No central canal compromise. There is a suggestion of paravertebral phlegmon around L3 of raising the possibility of acute or subacute compression fracture.  L4 compression fracture involving superior and inferior endplates with about 62% loss of vertebral body height. Retropulsion measures 5 mm and produces mild central stenosis. This fracture is favored to be chronic.  L5 shows an inferior endplate compression fracture which is mild, with central retropulsion of the inferior vertebral body but no significant central canal compromise. No definite paravertebral phlegmon.  Osteopenia is present in the sacrum and iliac bones without a displaced fracture. Poor mineralization is present diffusely consistent with osteopenia spinous process remodeling and sclerosis from L3 through L5 is compatible with [place] impingement.  IMPRESSION: Compression fractures at every level of the lumbar spine. Most of these appear chronic although age-indeterminate without recent prior imaging studies. Aging of the fractures is best performed with MRI if no contraindications. Nuclear medicine bone scan is a reasonable alternative if the patient cannot undergo MRI. Based on paravertebral phlegmon, suspect acute or subacute L3 compression fracture.   Electronically Signed   By: Dereck Ligas M.D.   On: 08/07/2013 05:27      Medications: I have reviewed the patient's current medications.  Impression: 1. Acute low back pain secondary to lumbar compression fracture,  acutely probably at L3. 2. Osteoporosis. 3. Hypertension.     Plan: 1. Continue with analgesia as required. 2. Await orthopedic consultation.  Consultants:  Orthopedic consultation with Dr. Luna Glasgow pending.   Procedures:  None.   Antibiotics:  None.                   Code Status: DO NOT RESUSCITATE.  Family Communication: Discussed plan with patient at the bedside.   Disposition Plan: Depending on progress.  Time spent: 15 minutes.   LOS: 1 day   Safford C   08/07/2013, 7:58 AM

## 2013-08-07 NOTE — H&P (Signed)
Patient's PCP: Robert Bellow, MD Patient's orthopedic physician: Dr. Luna Glasgow  Chief Complaint: Fall and back pain  History of Present Illness: Leah Raymond is a 80 y.o. Caucasian female with history of osteoporosis with lumbar compression fractures and hypertension who presents with the above complaints.  Patient reports that she chronically wears a brace for her chronic back pain and lumbar compression fracture.on 08/06/2013 in the morning at 7 a.m. after she had her breakfast she was cleaning around the house, she picked something up in her arms and she accidentally sat on the arm of the chair.  She indicated that she felt something pop in her back.  She was having excruciating pain.  She used her walker which she has not used for months to years to get by around her house.  Due to her ongoing pain she presented to the emergency department for further evaluation.  In the emergency department, x-ray of her spine showed severe L1 and L2 compression fracture, L2 is new from 2010, mild L3 and L4 compression fractures.  Hospitalist service was asked to admit the patient for further care and management.  Patient denies any recent fevers, chills, nausea, vomiting, chest pain, shortness of breath, abdominal pain, diarrhea, headaches or vision changes.    Review of Systems: All systems reviewed with the patient and positive as per history of present illness, otherwise all other systems are negative.  Past Medical History  Diagnosis Date  . Osteoporosis   . Spinal fracture   . Hypertension   . Cancer    Past Surgical History  Procedure Laterality Date  . Abdominal hysterectomy    . Cholecystectomy    . Hip arthroplasty    . Mastectomy     Family History  Problem Relation Age of Onset  . Colon cancer Mother   . Liver cancer Father    History   Social History  . Marital Status: Married    Spouse Name: N/A    Number of Children: N/A  . Years of Education: N/A   Occupational History   . Not on file.   Social History Main Topics  . Smoking status: Never Smoker   . Smokeless tobacco: Not on file  . Alcohol Use: No  . Drug Use: No  . Sexual Activity: No   Other Topics Concern  . Not on file   Social History Narrative  . No narrative on file   Allergies: Penicillins and Morphine and related  Home Meds: Prior to Admission medications   Medication Sig Start Date End Date Taking? Authorizing Provider  ALPRAZolam Duanne Moron) 0.5 MG tablet Take 0.5 mg by mouth 2 (two) times daily.      Historical Provider, MD  amitriptyline (ELAVIL) 10 MG tablet Take 10 mg by mouth at bedtime.      Historical Provider, MD  amLODipine (NORVASC) 5 MG tablet Take 5 mg by mouth daily.      Historical Provider, MD  HYDROcodone-acetaminophen (NORCO) 7.5-325 MG per tablet Take 1 tablet by mouth every 6 (six) hours as needed.      Historical Provider, MD  Multiple Vitamin (MULTIVITAMIN) capsule Take 1 capsule by mouth daily.      Historical Provider, MD  temazepam (RESTORIL) 22.5 MG capsule Take 22.5 mg by mouth at bedtime as needed.      Historical Provider, MD    Physical Exam: Blood pressure 133/70, pulse 94, temperature 98.5 F (36.9 C), temperature source Oral, resp. rate 15, height 4\' 10"  (1.473 m), weight 46.[place]  kg (103 lb), SpO2 97.00%. General: Awake, Oriented x3, No acute distress. HEENT: EOMI, Moist mucous membranes Neck: Supple CV: S1 and S2 Lungs: Clear to ascultation bilaterally Abdomen: Soft, Nontender, Nondistended, +bowel sounds. Ext: Good pulses. Trace edema. No clubbing or cyanosis noted. Neuro: Cranial Nerves II-XII grossly intact. Has 5/5 motor strength in upper and lower extremities. Back: Back pain on palpation.  Lab results:  Recent Labs  08/07/13 0349  NA 138  K 3.4*  CL 97  CO2 32  GLUCOSE 105*  BUN 8  CREATININE 0.51  CALCIUM 9.1    Recent Labs  08/07/13 0349  AST 22  ALT 15  ALKPHOS 95  BILITOT 0.4  PROT 6.9  ALBUMIN 3.6   No results found  for this basename: LIPASE, AMYLASE,  in the last 72 hours  Recent Labs  08/07/13 0349  WBC 10.0  NEUTROABS 7.9*  HGB 11.7*  HCT 34.3*  MCV 92.7  PLT 225   No results found for this basename: CKTOTAL, CKMB, CKMBINDEX, TROPONINI,  in the last 72 hours No components found with this basename: POCBNP,  No results found for this basename: DDIMER,  in the last 72 hours No results found for this basename: HGBA1C,  in the last 72 hours No results found for this basename: CHOL, HDL, LDLCALC, TRIG, CHOLHDL, LDLDIRECT,  in the last 72 hours No results found for this basename: TSH, T4TOTAL, FREET3, T3FREE, THYROIDAB,  in the last 72 hours No results found for this basename: VITAMINB12, FOLATE, FERRITIN, TIBC, IRON, RETICCTPCT,  in the last 72 hours Imaging results:  Dg Lumbar Spine Complete  08/07/2013   CLINICAL DATA:  Fall, hip tenderness and back pain.  EXAM: LUMBAR SPINE - COMPLETE 4+ VIEW  COMPARISON:  Bone scan September 28, 2011 and CT of the chest October 22, 2008  FINDINGS: Patient is osteopenic which may decrease sensitivity for acute nondisplaced fractures ; severe compression deformities of vertebral body L1, L2, mild compression deformities of L3 and L4, the L2 fracture is new from prior CT. The lower levels were not included on prior CT. No malalignment.  Intervertebral disc heights generally preserved. Mild L5-S1 facet arthropathy. Status post bilateral femoral neck pinning. Phleboliths in the pelvis. Moderate aortoiliac vascular calcifications. Surgical clips in the right abdomen likely reflect cholecystectomy. Moderate amount of retained large bowel stool.  IMPRESSION: Severe L1 and L2 compression fractures (L2 is new from 2010), mild L3 and L4 compression fractures, not previously imaged. Osteopenia decreases sensitivity for acute nondisplaced fractures. No malalignment.   Electronically Signed   By: Elon Alas   On: 08/07/2013 00:38   Dg Hip Complete Right  08/07/2013   CLINICAL DATA:   Status post fall with hip pain  EXAM: RIGHT HIP - COMPLETE 2+ VIEW  COMPARISON:  None.  FINDINGS: There is no evidence of hip fracture or dislocation. Right femoral rod is identified.  IMPRESSION: No acute fracture or dislocation.   Electronically Signed   By: Abelardo Diesel M.D.   On: 08/07/2013 00:31    Assessment & Plan by Problem: Acute on chronic back pain due to severe L1 and L2 compression fracture, L2 is new from 2010, mild L3 and L4 compression fractures Pain control with pain medications.  Start the patient on a bowel regimen as she will be on narcotics.  Requested physical therapy and outpatient therapy consult.  Requested consultation with Dr. Luna Glasgow, patient's orthopedic physician who has managed her lumbar compression fracture in the past.  Osteoporosis Start calcium  with vitamin D.  Further management as outpatient.    Hypertension Continue home antihypertensive medications.  Stable.  Prophylaxis Lovenox.  CODE STATUS DO NOT RESUSCITATE/DO NOT INTUBATE.  This was discussed with the patient at the time of admission.  Disposition Admit the patient to medical bed as inpatient.  Time spent on admission, talking to the patient, and coordinating care was: 50 mins.  Alivya Wegman A, MD 08/07/2013, 3:52 AM

## 2013-08-07 NOTE — ED Notes (Signed)
Returned from CT.

## 2013-08-07 NOTE — Evaluation (Signed)
Occupational Therapy Evaluation Patient Details Name: Leah Raymond MRN: [id number] DOB: 08-12-1930 Today's Date: 08/07/2013 Time: 7035-0093 OT Time Calculation (min): 27 min Eval 27'  OT Assessment / Plan / Recommendation History of present illness Leah Raymond is a 80 y.o. Caucasian female with history of osteoporosis with lumbar compression fractures and hypertension who presents with the above complaints.  Patient reports that she chronically wears a brace for her chronic back pain and lumbar compression fracture.on 08/06/2013 in the morning at 7 a.m. after she had her breakfast she was cleaning around the house, she picked something up in her arms and she accidentally sat on the arm of the chair.  She indicated that she felt something pop in her back.  She was having excruciating pain.  She used her walker which she has not used for months to years to get by around her house.  Due to her ongoing pain she presented to the emergency department for further evaluation.  In the emergency department, x-ray of her spine showed severe L1 and L2 compression fracture, L2 is new from 2010, mild L3 and L4 compression fractures.  Hospitalist service was asked to admit the patient for further care and management   Clinical Impression   Pt is 80 year old presenting to acute OT with above circumstances. She was independent in ADLs prior to this admission and only required min assist for some IADL tasks (meals on wheels). She is also primary caregiver for her husband, who has dementia.  At this time pt will require at least min assist for her ADL tasks.  Pt would benefit from skilled OT services at SNF for strengthening and ADL training; however, pt strongly prefers to be discharged home in order to continue caring for her husband (with [place]).    OT Assessment  Patient needs continued OT Services    Follow Up Recommendations  SNF;Home health OT (Pt would benefit from skilled OT services at SNF, but strongly  prefers HHOT option. Reccommend SNF unless pt's home safety/caregiving responsibilites can be addressed.)       Equipment Recommendations  3 in 1 bedside comode       Frequency  Min 2X/week    Precautions / Restrictions Precautions Precautions: Fall Restrictions Weight Bearing Restrictions: No       ADL  Eating/Feeding: Set up Grooming: Set up Lower Body Bathing: Moderate assistance Lower Body Dressing: Moderate assistance ADL Comments: At this time, pt has difficulty bending/pulling/lifting    OT Diagnosis: Generalized weakness  OT Problem List: Decreased strength;Decreased range of motion;Decreased activity tolerance;Impaired UE functional use;Pain OT Treatment Interventions: Self-care/ADL training;Therapeutic exercise;Energy conservation;DME and/or AE instruction;Therapeutic activities;Patient/family education   OT Goals(Current goals can be found in the care plan section) Acute Rehab OT Goals Patient Stated Goal: "I want to get better as quick as I can." OT Goal Formulation: With patient Time For Goal Achievement: 08/21/13 Potential to Achieve Goals: Fair ADL Goals Pt Will Perform Lower Body Bathing: with min assist;with adaptive equipment Pt Will Perform Lower Body Dressing: with min assist;with adaptive equipment Additional ADL Goal #1: Pt will be educated on HEP  Visit Information  Last OT Received On: 08/07/13 History of Present Illness: Leah Raymond is a 79 y.o. Caucasian female with history of osteoporosis with lumbar compression fractures and hypertension who presents with the above complaints.  Patient reports that she chronically wears a brace for her chronic back pain and lumbar compression fracture.on 08/06/2013 in the morning at 7 a.m. after she  had her breakfast she was cleaning around the house, she picked something up in her arms and she accidentally sat on the arm of the chair.  She indicated that she felt something pop in her back.  She was having  excruciating pain.  She used her walker which she has not used for months to years to get by around her house.  Due to her ongoing pain she presented to the emergency department for further evaluation.  In the emergency department, x-ray of her spine showed severe L1 and L2 compression fracture, L2 is new from 2010, mild L3 and L4 compression fractures.  Hospitalist service was asked to admit the patient for further care and management       Prior [place] expects to be discharged to:: Skilled nursing facility Living Arrangements: Spouse/significant other (Pt is caregiver for her husband, who has dementia) Available Help at Discharge:  (Pt has nephew nearby, but states he has health concerns that limit his assistance.  Pt is unsure who else could assist.) Home Equipment: Walker - 2 wheels;Walker - 4 wheels;Shower seat;Hand held shower head;Adaptive equipment (Pt has BSC, but it is in disrepair) Adaptive Equipment: Sock aid Prior Function Level of Independence: Needs assistance ADL's / Homemaking Assistance Needed: Neighbor assist at times, but pt did not elaborate in what capacity. Receives meals on wheels 5 days per week. Communication Communication: No difficulties Dominant Hand: Right     Vision/Perception Vision - History Baseline Vision: Wears glasses all the time   Cognition  Cognition Arousal/Alertness: Awake/alert Behavior During Therapy: WFL for tasks assessed/performed Overall Cognitive Status: Within Functional Limits for tasks assessed    Extremity/Trunk Assessment Upper Extremity Assessment Upper Extremity Assessment: Generalized weakness (some pain in bilateral UE with movements) Lower Extremity Assessment Lower Extremity Assessment: Defer to PT evaluation Cervical / Trunk Assessment Cervical / Trunk Assessment: Kyphotic     Mobility Bed Mobility Overal bed mobility: Needs Assistance Bed Mobility: Rolling Rolling: Mod  assist General bed mobility comments: Pt begins to have significant leg cramps with any type of heelslide to prepare to roll Transfers General transfer comment: unable to come to sitting due to pain at this time.     Exercise Other Exercises Other Exercises: Pt started on Sara Meeks decompression exersices.  PT was able to tolerate 1-3 Other Exercises: 1-supine with arms abducted to 30 degrees in ER  x 5 minutes Other Exercises: 2- scapular retraction x 10 in supine position Other Exercises: 3- cervical retraction in supine position x 10 Other Exercises: 4-(unable to tolerate )pt in bent kneed position- pt extends Rt leg pushing heel out as far as possible then return to bent knee positon repeat with LT      End of Session OT - End of Session Activity Tolerance: Patient tolerated treatment well Patient left: in bed;with nursing/sitter in room      Bea Graff, [place], OTR/L [phone]  08/07/2013, 12:38 PM

## 2013-08-07 NOTE — Clinical Social Work Psychosocial (Signed)
Clinical Social Work Department BRIEF PSYCHOSOCIAL ASSESSMENT 08/07/2013  Patient:  Leah Raymond, Leah Raymond     Account Number:  [id number]     Admit date:  08/06/2013  Clinical Social Worker:  Wyatt Haste  Date/Time:  08/07/2013 03:29 PM  Referred by:  Physician  Date Referred:  08/07/2013 Referred for  SNF Placement   Other Referral:   Interview type:  Patient Other interview type:    PSYCHOSOCIAL DATA Living Status:  FAMILY Admitted from facility:   Level of care:   Primary support name:  Estill Bamberg Primary support relationship to patient:  FRIEND Degree of support available:   supportive per pt    CURRENT CONCERNS Current Concerns  Post-Acute Placement   Other Concerns:    SOCIAL WORK ASSESSMENT / PLAN CSW met with pt at bedside. Pt alert and oriented and reports she lives at home with her husband. Pt came to ED via EMS after a fall. Found to have compression fractures. Pt's nephew lives next door. Pt indicates that her best friend Estill Bamberg is a good support for pt. Her husband has dementia and pt is wanting to get home to him as quickly as possible. Pt said Estill Bamberg is with him now and is hoping she will stay overnight. CSW confirmed that Estill Bamberg will stay with pt's permission. Pt is independent with ADLs at baseline. She still drives. Pt has hired someone to help with some cleaning in the home. They receive meals on wheels. Pt evaluated by PT today and recommendation is for SNF. Pt has been to [facility] in the past. She states she really wants to return home and is willing to hire private duty care in the home in order to do so. Pt is aware that she is currently in observation and is aware of Medicare coverage/criteria for SNF. CM provided private duty care agency list and discussed home health. Ortho consult completed today as well. Pt aware of recommendation for several days of bed rest.   Assessment/plan status:  Referral to [employer] Other assessment/ plan:   Information/referral  to community resources:   SNF list  CM for home health/private duty care    PATIENT'S/FAMILY'S RESPONSE TO PLAN OF CARE: CSW discussed SNF, but pt refuses. She plans to return home with hiring private duty care in the home. Her best friend will be staying with her husband until other assistance is arranged. CSW will sign off, but can be reconsulted if needed.       Benay Pike, [place]

## 2013-08-07 NOTE — ED Notes (Signed)
MD at bedside. 

## 2013-08-07 NOTE — ED Notes (Signed)
Patient refused Morphine IV order due to issues with nausea- Med not given and new order received.

## 2013-08-07 NOTE — Progress Notes (Signed)
Utilization Review Complete  

## 2013-08-07 NOTE — Consult Note (Signed)
Reason for Consult:Back Pain Referring Physician: Hospitalist  Leah Raymond is an 80 y.o. female.  HPI: Patient is well known to me.  She has severe osteoporosis of the lumbar and thoracic spine.  She has had multiple compression fractures in the past with slow healing and chronic pain. She wears a CASH brace most of the time.  She takes care of her husband of 38 years.  He has dementia.  She had sat down hard the other day and began having marked pain in the lumbar spine.  It got worse and worse.  X-rays, CT show multiple compression fractures of the lumbar spine.  Her L2 and L4 are old as is L1.  I have reviewed the films and I feel the L3 fracture is different and is new.  It is consistent where her pain is located.  She will need bed rest for a few days.  Then continue the CASH brace.   She takes Restoril 10 chronically at night.  I will order this if OK.  I can see her in my office next week.  Past Medical History  Diagnosis Date  . Osteoporosis   . Spinal fracture   . Hypertension   . Cancer     Past Surgical History  Procedure Laterality Date  . Abdominal hysterectomy    . Cholecystectomy    . Hip arthroplasty    . Mastectomy      Family History  Problem Relation Age of Onset  . Colon cancer Mother   . Liver cancer Father     Social History:  reports that she has never smoked. She does not have any smokeless tobacco history on file. She reports that she does not drink alcohol or use illicit drugs.  Allergies:  Allergies  Allergen Reactions  . Penicillins Shortness Of Breath  . Morphine And Related Nausea And Vomiting    Medications: I have reviewed the patient's current medications.  Results for orders placed during the hospital encounter of 08/06/13 (from the past 48 hour(s))  CBC WITH DIFFERENTIAL     Status: Abnormal   Collection Time    08/07/13  3:49 AM      Result Value Ref Range   WBC 10.0  4.0 - 10.5 K/uL   RBC 3.70 (*) 3.87 - 5.11 MIL/uL   Hemoglobin 11.7 (*) 12.0 - 15.0 g/dL   HCT 34.3 (*) 36.0 - 46.0 %   MCV 92.7  78.0 - 100.0 fL   MCH 31.6  26.0 - 34.0 pg   MCHC 34.1  30.0 - 36.0 g/dL   RDW 12.0  11.5 - 15.5 %   Platelets 225  150 - 400 K/uL   Neutrophils Relative % 78 (*) 43 - 77 %   Neutro Abs 7.9 (*) 1.7 - 7.7 K/uL   Lymphocytes Relative 9 (*) 12 - 46 %   Lymphs Abs 0.9  0.7 - 4.0 K/uL   Monocytes Relative 9  3 - 12 %   Monocytes Absolute 0.9  0.1 - 1.0 K/uL   Eosinophils Relative 4  0 - 5 %   Eosinophils Absolute 0.4  0.0 - 0.7 K/uL   Basophils Relative 0  0 - 1 %   Basophils Absolute 0.0  0.0 - 0.1 K/uL  COMPREHENSIVE METABOLIC PANEL     Status: Abnormal   Collection Time    08/07/13  3:49 AM      Result Value Ref Range   Sodium 138  137 - 147 mEq/L  Potassium 3.4 (*) 3.7 - 5.3 mEq/L   Chloride 97  96 - 112 mEq/L   CO2 32  19 - 32 mEq/L   Glucose, Bld 105 (*) 70 - 99 mg/dL   BUN 8  6 - 23 mg/dL   Creatinine, Ser 0.51  0.50 - 1.10 mg/dL   Calcium 9.1  8.4 - 10.5 mg/dL   Total Protein 6.9  6.0 - 8.3 g/dL   Albumin 3.6  3.5 - 5.2 g/dL   AST 22  0 - 37 U/L   ALT 15  0 - 35 U/L   Alkaline Phosphatase 95  39 - 117 U/L   Total Bilirubin 0.4  0.3 - 1.2 mg/dL   GFR calc non Af Amer 88 (*) >90 mL/min   GFR calc Af Amer >90  >90 mL/min   Comment: (NOTE)     The eGFR has been calculated using the CKD EPI equation.     This calculation has not been validated in all clinical situations.     eGFR's persistently <90 mL/min signify possible Chronic Kidney     Disease.  PROTIME-INR     Status: None   Collection Time    08/07/13  3:49 AM      Result Value Ref Range   Prothrombin Time 12.9  11.6 - 15.2 seconds   INR 0.99  0.00 - 1.49  MRSA PCR SCREENING     Status: None   Collection Time    08/07/13  8:05 AM      Result Value Ref Range   MRSA by PCR NEGATIVE  NEGATIVE   Comment:            The GeneXpert MRSA Assay (FDA     approved for NASAL specimens     only), is one component of a     comprehensive MRSA  colonization     surveillance program. It is not     intended to diagnose MRSA     infection nor to guide or     monitor treatment for     MRSA infections.    Dg Lumbar Spine Complete  08/07/2013   CLINICAL DATA:  Fall, hip tenderness and back pain.  EXAM: LUMBAR SPINE - COMPLETE 4+ VIEW  COMPARISON:  Bone scan September 28, 2011 and CT of the chest October 22, 2008  FINDINGS: Patient is osteopenic which may decrease sensitivity for acute nondisplaced fractures ; severe compression deformities of vertebral body L1, L2, mild compression deformities of L3 and L4, the L2 fracture is new from prior CT. The lower levels were not included on prior CT. No malalignment.  Intervertebral disc heights generally preserved. Mild L5-S1 facet arthropathy. Status post bilateral femoral neck pinning. Phleboliths in the pelvis. Moderate aortoiliac vascular calcifications. Surgical clips in the right abdomen likely reflect cholecystectomy. Moderate amount of retained large bowel stool.  IMPRESSION: Severe L1 and L2 compression fractures (L2 is new from 2010), mild L3 and L4 compression fractures, not previously imaged. Osteopenia decreases sensitivity for acute nondisplaced fractures. No malalignment.   Electronically Signed   By: Elon Alas   On: 08/07/2013 00:38   Dg Hip Complete Right  08/07/2013   CLINICAL DATA:  Status post fall with hip pain  EXAM: RIGHT HIP - COMPLETE 2+ VIEW  COMPARISON:  None.  FINDINGS: There is no evidence of hip fracture or dislocation. Right femoral rod is identified.  IMPRESSION: No acute fracture or dislocation.   Electronically Signed   By: Abelardo Diesel  M.D.   On: 08/07/2013 00:31   Ct Lumbar Spine Wo Contrast  08/07/2013   CLINICAL DATA:  Fall.  New compression fracture.  Back pain.  EXAM: CT LUMBAR SPINE WITHOUT CONTRAST  TECHNIQUE: Multidetector CT imaging of the lumbar spine was performed without intravenous contrast administration. Multiplanar CT image reconstructions were also  generated.  COMPARISON:  DG LUMBAR SPINE COMPLETE dated 08/07/2013; CT CHEST W/O CM dated 10/22/2008; DG CHEST 2 VIEW dated 01/08/2011  FINDINGS: Incidental imaging of the lung bases demonstrates scattered areas of scarring and atelectasis. Dense aortic atherosclerosis. Cholecystectomy. Nonobstructing right renal collecting system calculus. Ptosis of the right kidney. Surgical clip present as splenic hilum. Rounded low-density structure with high density material dependently adjacent to the upper pole of the left kidney probably represents gastric diverticulum. Severe aortoiliac atherosclerosis.  There are 5 lumbar type vertebral bodies. Every lumbar level demonstrates loss of vertebral body height.  T12 shows a shows superior endplate compression fracture with Schmorl's node and mild retropulsion. No significant central canal compromise. Ankylosis with the L1 vertebra on the left.  L1 shows 75% maximal loss of central vertebral body height. Retropulsion measures 5 mm and produces mild central stenosis.  L2 shows greater than 75% loss vertebral body height and a focal spicule of bone retropulsed in the midline, producing moderate to severe central stenosis. The retropulsed bone almost divides the central canal. No paravertebral phlegmon suggesting this is a chronic injury.  L3 shows a superior endplate compression fracture which is mild, with 25% loss of vertebral body height and minimal retropulsion. No central canal compromise. There is a suggestion of paravertebral phlegmon around L3 of raising the possibility of acute or subacute compression fracture.  L4 compression fracture involving superior and inferior endplates with about 65% loss of vertebral body height. Retropulsion measures 5 mm and produces mild central stenosis. This fracture is favored to be chronic.  L5 shows an inferior endplate compression fracture which is mild, with central retropulsion of the inferior vertebral body but no significant central  canal compromise. No definite paravertebral phlegmon.  Osteopenia is present in the sacrum and iliac bones without a displaced fracture. Poor mineralization is present diffusely consistent with osteopenia spinous process remodeling and sclerosis from L3 through L5 is compatible with [place] impingement.  IMPRESSION: Compression fractures at every level of the lumbar spine. Most of these appear chronic although age-indeterminate without recent prior imaging studies. Aging of the fractures is best performed with MRI if no contraindications. Nuclear medicine bone scan is a reasonable alternative if the patient cannot undergo MRI. Based on paravertebral phlegmon, suspect acute or subacute L3 compression fracture.   Electronically Signed   By: Dereck Ligas M.D.   On: 08/07/2013 05:27    Review of Systems  Cardiovascular:       Hypertension.  Musculoskeletal: Positive for back pain (Long history of severe osteoporosis of the back.  She wears a CASH brace chronically.).  Endo/Heme/Allergies:       Severe generalized osteoporosis.   Blood pressure 175/92, pulse 90, temperature 98.2 F (36.8 C), temperature source Oral, resp. rate 20, height 4' 10"  (1.473 m), weight 37 kg (81 lb 9.1 oz), SpO2 95.00%. Physical Exam  Constitutional: She is oriented to person, place, and time.  Very thin.  HENT:  Head: Normocephalic and atraumatic.  Eyes: Conjunctivae and EOM are normal. Pupils are equal, round, and reactive to light.  Neck: Normal range of motion. Neck supple.  Cardiovascular: Normal rate, regular rhythm and intact distal pulses.  Respiratory: Effort normal.  GI: Soft.  Musculoskeletal: She exhibits tenderness (Lower back pain, more mid lumbar area.  Tightness.  Neurologically intact.).       Lumbar back: She exhibits decreased range of motion, tenderness and bony tenderness.       Back:  Neurological: She is alert and oriented to person, place, and time. She has normal reflexes.  Skin: Skin is  warm and dry.  Psychiatric: She has a normal mood and affect. Her behavior is normal. Judgment and thought content normal.    Assessment/Plan: Compression fractures and severe osteoporosis, with what appears a new acute fracture at L3.  Use CASH brace.  Pain medicine and anti-spasm medicines.  I can see in office next week.  She can be further evaluated for long term Rx for the osteoporosis.  Ayoub Arey 08/07/2013, 1:04 PM

## 2013-08-07 NOTE — Evaluation (Signed)
Physical Therapy Evaluation Patient Details Name: Leah Raymond MRN: [id number] DOB: 02-13-1931 Today's Date: 08/07/2013 Time: 8338-2505 PT Time Calculation (min): 15 min  PT Assessment / Plan / Recommendation History of Present Illness  Leah Raymond is a 80 y.o. Caucasian female with history of osteoporosis with lumbar compression fractures and hypertension who presents with the above complaints.  Patient reports that she chronically wears a brace for her chronic back pain and lumbar compression fracture.on 08/06/2013 in the morning at 7 a.m. after she had her breakfast she was cleaning around the house, she picked something up in her arms and she accidentally sat on the arm of the chair.  She indicated that she felt something pop in her back.  She was having excruciating pain.  She used her walker which she has not used for months to years to get by around her house.  Due to her ongoing pain she presented to the emergency department for further evaluation.  In the emergency department, x-ray of her spine showed severe L1 and L2 compression fracture, L2 is new from 2010, mild L3 and L4 compression fractures.  Hospitalist service was asked to admit the patient for further care and management  Clinical Impression  Pt has significant increased pain with any type of motion.  Pt has a brace at home but unsure if this is a CASH brace or not.  Therapist requested pt to have someone bring the brace in from home.  PT will need continued skilled care to work on strengthening and postural exercises.  Pt will best be served with decompression exercises to strengthen the spinal mm but avoid increased compression.      PT Assessment  Patient needs continued PT services    Follow Up Recommendations  SNF    Does the patient have the potential to tolerate intense rehabilitation    no  Barriers to Discharge  decreased mobility      Equipment Recommendations  None recommended by PT    Recommendations for  Other Services  none- OT already consulted.   Frequency Min 5X/week    Precautions / Restrictions Precautions Precautions: Fall Restrictions Weight Bearing Restrictions: No   Pertinent Vitals/Pain At rest pain is doable (3/10).  With minor motion (heelslides in bed) pain increases to a 9/10.      Mobility  Bed Mobility Overal bed mobility: Needs Assistance Bed Mobility: Rolling Rolling: Mod assist General bed mobility comments: Pt begins to have significant leg cramps with any type of heelslide to prepare to roll Transfers General transfer comment: unable to come to sitting due to pain at this time.    Exercises Other Exercises Other Exercises: Pt started on [place] decompression exersices.  PT was able to tolerate 1-3 Other Exercises: 1-supine with arms abducted to 30 degrees in ER  x 5 minutes Other Exercises: 2- scapular retraction x 10 in supine position Other Exercises: 3- cervical retraction in supine position x 10 Other Exercises: 4-(unable to tolerate )pt in bent kneed position- pt extends Rt leg pushing heel out as far as possible then return to bent knee positon repeat with LT   PT Diagnosis: Difficulty walking;Generalized weakness  PT Problem List: Decreased strength;Decreased activity tolerance;Decreased mobility;Pain PT Treatment Interventions: Therapeutic exercise;Gait training;Therapeutic activities;Patient/family education     PT Goals(Current goals can be found in the care plan section) Acute Rehab PT Goals Patient Stated Goal: Pt states she wants to be able to walk again but the pain is too severe at this  time PT Goal Formulation: With patient Time For Goal Achievement: 08/09/13 Potential to Achieve Goals: Good  Visit Information  Last PT Received On: 08/07/13 History of Present Illness: Leah Raymond is a 80 y.o. Caucasian female with history of osteoporosis with lumbar compression fractures and hypertension who presents with the above complaints.   Patient reports that she chronically wears a brace for her chronic back pain and lumbar compression fracture.on 08/06/2013 in the morning at 7 a.m. after she had her breakfast she was cleaning around the house, she picked something up in her arms and she accidentally sat on the arm of the chair.  She indicated that she felt something pop in her back.  She was having excruciating pain.  She used her walker which she has not used for months to years to get by around her house.  Due to her ongoing pain she presented to the emergency department for further evaluation.  In the emergency department, x-ray of her spine showed severe L1 and L2 compression fracture, L2 is new from 2010, mild L3 and L4 compression fractures.  Hospitalist service was asked to admit the patient for further care and management       Prior [place] expects to be discharged to:: Skilled nursing facility Living Arrangements: Spouse/significant other Home Equipment: Walker - 2 wheels Prior Function Level of Independence: Independent Communication Communication: No difficulties    Cognition  Cognition Arousal/Alertness: Awake/alert Overall Cognitive Status: Within Functional Limits for tasks assessed    Extremity/Trunk Assessment Lower Extremity Assessment Lower Extremity Assessment: Generalized weakness Cervical / Trunk Assessment Cervical / Trunk Assessment: Kyphotic   Balance    End of Session PT - End of Session Activity Tolerance: Patient limited by pain Patient left: in bed  GP     Leah Raymond,Leah Raymond 08/07/2013, 10:58 AM

## 2013-08-07 NOTE — Care Management Note (Addendum)
    Page 1 of 2   08/08/2013     3:31:03 PM   CARE MANAGEMENT NOTE 08/08/2013  Patient:  Leah Raymond, Leah Raymond   Account Number:  [id number]  Date Initiated:  08/07/2013  Documentation initiated by:  Claretha Cooper  Subjective/Objective Assessment:   pt lives at home with her husband who has dementia. Pt would like to return home with [facility] RN and PT. Requests a BSC.     Action/Plan:   Anticipated DC Date:  08/08/2013   Anticipated DC Plan:  [place]  In-house referral  Clinical Social Worker      DC Forensic scientist  CM consult      PAC Choice  [place]   Choice offered to / List presented to:  C-1 Patient   DME arranged  [facility]      DME agency  [place] arranged  HH-1 RN  HH-2 PT      [facility] agency  [place]   Status of service:  Completed, signed off Medicare Important Message given?   (If response is "NO", the following Medicare IM given date fields will be blank) Date Medicare IM given:   Date Additional Medicare IM given:    Discharge Disposition:    Per UR Regulation:    If discussed at Long Length of Stay Meetings, dates discussed:    Comments:  08/08/13 Claretha Cooper RN BSN CM Pt unable to qualify for SNF since she does not have a 3 day qualifing stay. CM informed that nephew was upset regarding this. Anticipated nephew arriving to [facility] at lunch time. As of 3:30, not arrived.  08/07/13 Claretha Cooper RN BSN CM Pt will return home tomorrow. Pt given a Code 71, list of Private Duty Nursing agencies.

## 2013-08-07 NOTE — ED Notes (Signed)
To CT for Lumbar scan with Bambi Blanch Media and Rollene Fare, NT

## 2013-08-08 MED ORDER — CYCLOBENZAPRINE HCL 10 MG PO TABS
5.0000 mg | ORAL_TABLET | Freq: Three times a day (TID) | ORAL | Status: DC | PRN
  Administered 2013-08-08 – 2013-08-09 (×5): 5 mg via ORAL
  Filled 2013-08-08 (×5): qty 1

## 2013-08-08 MED ORDER — POTASSIUM CHLORIDE CRYS ER 20 MEQ PO TBCR
40.0000 meq | EXTENDED_RELEASE_TABLET | Freq: Every day | ORAL | Status: DC
  Administered 2013-08-08 – 2013-08-09 (×2): 40 meq via ORAL
  Filled 2013-08-08 (×2): qty 2

## 2013-08-08 NOTE — Progress Notes (Signed)
Subjective: My back feels better   Objective: Vital signs in last 24 hours: Temp:  [98.1 F (36.7 C)-98.4 F (36.9 C)] 98.2 F (36.8 C) (02/12 1423) Pulse Rate:  [94-99] 96 (02/12 1423) Resp:  [20] 20 (02/12 1423) BP: (135-152)/(65-81) 137/67 mmHg (02/12 1423) SpO2:  [91 %-96 %] 96 % (02/12 1423) Weight:  [37.3 kg (82 lb 3.7 oz)] 37.3 kg (82 lb 3.7 oz) (02/12 0503)  Intake/Output from previous day: 02/11 0701 - 02/12 0700 In: 480 [P.O.:480] Out: 1050 [Urine:1050] Intake/Output this shift: Total I/O In: 240 [P.O.:240] Out: 400 [Urine:400]   Recent Labs  08/07/13 0349  HGB 11.7*    Recent Labs  08/07/13 0349  WBC 10.0  RBC 3.70*  HCT 34.3*  PLT 225    Recent Labs  08/07/13 0349  NA 138  K 3.4*  CL 97  CO2 32  BUN 8  CREATININE 0.51  GLUCOSE 105*  CALCIUM 9.1    Recent Labs  08/07/13 0349  INR 0.99    Neurologically intact Neurovascular intact Sensation intact distally Intact pulses distally Dorsiflexion/Plantar flexion intact  She had a much better day today and wore her brace and set in chair and did some walking.  NV is intact.  Assessment/Plan: Acute compression fracture of L3.  Marked osteoporosis.  Continue CASH brace.  I can see in office next week Thursday or Friday.   Leah Raymond 08/08/2013, 5:04 PM

## 2013-08-08 NOTE — Progress Notes (Signed)
Leah Raymond JKD:[id number] DOB: [birth year] DOA: 08/06/2013 PCP: Robert Bellow, MD   Subjective: This 80 year old lady who has osteoporosis was admitted with back pain and has been found to have several compression fractures in her lumbar spine. It looks like L3 is acute. She is in pain. She was seen by orthopedics yesterday and Dr. Luna Glasgow has cleared her for discharge but unfortunately, she is really not mobilized at all in the hospital. She is not keen to go to a skilled nursing facility at all. She tells me that her analgesia is adequate.           Physical Exam: Blood pressure 152/81, pulse 99, temperature 98.4 F (36.9 C), temperature source Oral, resp. rate 20, height 4\' 10"  (1.473 m), weight 37.3 kg (82 lb 3.7 oz), SpO2 91.00%. She looks systemically well. Heart sounds are present and normal. Lung fields are clear. She is alert and oriented.   Investigations:  Recent Results (from the past 240 hour(s))  MRSA PCR SCREENING     Status: None   Collection Time    08/07/13  8:05 AM      Result Value Ref Range Status   MRSA by PCR NEGATIVE  NEGATIVE Final   Comment:            The GeneXpert MRSA Assay (FDA     approved for NASAL specimens     only), is one component of a     comprehensive MRSA colonization     surveillance program. It is not     intended to diagnose MRSA     infection nor to guide or     monitor treatment for     MRSA infections.     Basic Metabolic Panel:  Recent Labs  08/07/13 0349  NA 138  K 3.4*  CL 97  CO2 32  GLUCOSE 105*  BUN 8  CREATININE 0.51  CALCIUM 9.1   Liver Function Tests:  Recent Labs  08/07/13 0349  AST 22  ALT 15  ALKPHOS 95  BILITOT 0.4  PROT 6.9  ALBUMIN 3.6     CBC:  Recent Labs  08/07/13 0349  WBC 10.0  NEUTROABS 7.9*  HGB 11.7*  HCT 34.3*  MCV 92.7  PLT 225    Dg Lumbar Spine Complete  08/07/2013   CLINICAL DATA:  Fall, hip tenderness and back pain.  EXAM: LUMBAR SPINE - COMPLETE 4+ VIEW   COMPARISON:  Bone scan September 28, 2011 and CT of the chest October 22, 2008  FINDINGS: Patient is osteopenic which may decrease sensitivity for acute nondisplaced fractures ; severe compression deformities of vertebral body L1, L2, mild compression deformities of L3 and L4, the L2 fracture is new from prior CT. The lower levels were not included on prior CT. No malalignment.  Intervertebral disc heights generally preserved. Mild L5-S1 facet arthropathy. Status post bilateral femoral neck pinning. Phleboliths in the pelvis. Moderate aortoiliac vascular calcifications. Surgical clips in the right abdomen likely reflect cholecystectomy. Moderate amount of retained large bowel stool.  IMPRESSION: Severe L1 and L2 compression fractures (L2 is new from 2010), mild L3 and L4 compression fractures, not previously imaged. Osteopenia decreases sensitivity for acute nondisplaced fractures. No malalignment.   Electronically Signed   By: Elon Alas   On: 08/07/2013 00:38   Dg Hip Complete Right  08/07/2013   CLINICAL DATA:  Status post fall with hip pain  EXAM: RIGHT HIP - COMPLETE 2+ VIEW  COMPARISON:  None.  FINDINGS: There is  no evidence of hip fracture or dislocation. Right femoral rod is identified.  IMPRESSION: No acute fracture or dislocation.   Electronically Signed   By: Abelardo Diesel M.D.   On: 08/07/2013 00:31   Ct Lumbar Spine Wo Contrast  08/07/2013   CLINICAL DATA:  Fall.  New compression fracture.  Back pain.  EXAM: CT LUMBAR SPINE WITHOUT CONTRAST  TECHNIQUE: Multidetector CT imaging of the lumbar spine was performed without intravenous contrast administration. Multiplanar CT image reconstructions were also generated.  COMPARISON:  DG LUMBAR SPINE COMPLETE dated 08/07/2013; CT CHEST W/O CM dated 10/22/2008; DG CHEST 2 VIEW dated 01/08/2011  FINDINGS: Incidental imaging of the lung bases demonstrates scattered areas of scarring and atelectasis. Dense aortic atherosclerosis. Cholecystectomy. Nonobstructing  right renal collecting system calculus. Ptosis of the right kidney. Surgical clip present as splenic hilum. Rounded low-density structure with high density material dependently adjacent to the upper pole of the left kidney probably represents gastric diverticulum. Severe aortoiliac atherosclerosis.  There are 5 lumbar type vertebral bodies. Every lumbar level demonstrates loss of vertebral body height.  T12 shows a shows superior endplate compression fracture with Schmorl's node and mild retropulsion. No significant central canal compromise. Ankylosis with the L1 vertebra on the left.  L1 shows 75% maximal loss of central vertebral body height. Retropulsion measures 5 mm and produces mild central stenosis.  L2 shows greater than 75% loss vertebral body height and a focal spicule of bone retropulsed in the midline, producing moderate to severe central stenosis. The retropulsed bone almost divides the central canal. No paravertebral phlegmon suggesting this is a chronic injury.  L3 shows a superior endplate compression fracture which is mild, with 25% loss of vertebral body height and minimal retropulsion. No central canal compromise. There is a suggestion of paravertebral phlegmon around L3 of raising the possibility of acute or subacute compression fracture.  L4 compression fracture involving superior and inferior endplates with about 579FGE loss of vertebral body height. Retropulsion measures 5 mm and produces mild central stenosis. This fracture is favored to be chronic.  L5 shows an inferior endplate compression fracture which is mild, with central retropulsion of the inferior vertebral body but no significant central canal compromise. No definite paravertebral phlegmon.  Osteopenia is present in the sacrum and iliac bones without a displaced fracture. Poor mineralization is present diffusely consistent with osteopenia spinous process remodeling and sclerosis from L3 through L5 is compatible with [place]  impingement.  IMPRESSION: Compression fractures at every level of the lumbar spine. Most of these appear chronic although age-indeterminate without recent prior imaging studies. Aging of the fractures is best performed with MRI if no contraindications. Nuclear medicine bone scan is a reasonable alternative if the patient cannot undergo MRI. Based on paravertebral phlegmon, suspect acute or subacute L3 compression fracture.   Electronically Signed   By: Dereck Ligas M.D.   On: 08/07/2013 05:27      Medications: I have reviewed the patient's current medications.  Impression: 1. Acute low back pain secondary to lumbar compression fracture, acutely probably at L3. 2. Osteoporosis. 3. Hypertension.     Plan: 1. Mobilize today. 2. Hopefully, she can be discharged home tomorrow once she mobilizes today.  Consultants:  Orthopedic consultation with Dr. Luna Glasgow .   Procedures:  None.   Antibiotics:  None.                   Code Status: DO NOT RESUSCITATE.  Family Communication: Discussed plan with patient at the bedside.  Disposition Plan: Depending on progress.  Time spent: 15 minutes.   LOS: 2 days   Neely Cecena C   08/08/2013, 8:30 AM

## 2013-08-08 NOTE — Progress Notes (Signed)
Patient up to chair. Tolerating well. Pain well controlled at this time per patient.

## 2013-08-08 NOTE — Progress Notes (Signed)
Physical Therapy Treatment Patient Details Name: Leah Raymond MRN: [id number] DOB: [birth year] Today's Date: 08/08/2013 Time: 8413-2440 PT Time Calculation (min): 42 min  PT Assessment / Plan / Recommendation  History of Present Illness  Pt reports feeling much better today.Marland KitchenMarland KitchenShe is up in a chair with assist of nursing.  Pt states "I know everyone thinks that I am anxious to get home but I really am not--I could go to the nursing home if you think I should".   PT Comments   Pts' CASH brace has been in the room with her all along and it was on her, but not snug enough.  She was instructed in correct  fit of brace.  She was able to perform all transfers with supervision only, however transfers were extremely labored.  She was able to ambulate with a walker for 20', supervision only, but again gait was very slow and labored.  She was again instructed in [place] Dragon Decompression exercises and she was able to tolerate all with no problem.. She had been medicated with pain meds and muscle relaxants prior to my visit and she had pain only with certain movements which was very fleeting.  She is very frail in body habitus and my recommendation is for her to go to SNF at d/c but apparently she will not qualify under Medicare guidelines.  I advised her that at home she would initially need full time CG assistance.  She appeared to understand my recommendations.  Follow Up Recommendations  Home health PT (pt does not have insurance coverage for SNF)     Does the patient have the potential to tolerate intense rehabilitation     Barriers to Discharge        Equipment Recommendations       Recommendations for Other Services    Frequency 7X/week   Progress towards PT Goals Progress towards PT goals: Progressing toward goals  Plan Discharge plan needs to be updated    Precautions / Restrictions Precautions Precautions: Fall Precaution Comments: pt is very osteoporotic Required Braces or Orthoses: Spinal  Brace Spinal Brace: Other (comment) Spinal Brace Comments: CASH brace to be worn when OOB Restrictions Weight Bearing Restrictions: No   Pertinent Vitals/Pain     Mobility  Bed Mobility Overal bed mobility: Needs Assistance Bed Mobility: Supine to Sit Rolling: Supervision Supine to sit: Supervision General bed mobility comments: Pt did not need any physical assist but transfer was very labored.Marland KitchenMarland KitchenI did attempt to instruct her in log roll technique for sitting at the edge of the bed but she stated that she has her own way of doing this that she spent 3 months in therapy learning Transfers Overall transfer level: Needs assistance Equipment used: Rolling walker (2 wheeled) Transfers: Sit to/from [facility] Sit to Stand: Min guard Stand pivot transfers: Min guard General transfer comment: pt was able to complete transfer with no physical assist but this was extremely slow and labored Ambulation/Gait Ambulation/Gait assistance: Supervision Ambulation Distance (Feet): 20 Feet Assistive device: Rolling walker (2 wheeled) Gait Pattern/deviations: Shuffle;Trunk flexed Gait velocity interpretation: Below normal speed for age/gender General Gait Details: gait with walker is stable    Exercises Other Exercises Other Exercises: [place] Dragon exercises 1-4 were completed with exception of #3 (pt states she cannot do this due to cervical spurs)...written exercise program given   PT Diagnosis:    PT Problem List:   PT Treatment Interventions:     PT Goals (current goals can now be found in the  care plan section)    Visit Information  Last PT Received On: 08/08/13    Subjective Data      Cognition  Cognition Arousal/Alertness: Awake/alert Behavior During Therapy: WFL for tasks assessed/performed Overall Cognitive Status: Within Functional Limits for tasks assessed    Balance  Balance Overall balance assessment: No apparent balance deficits (not formally assessed)   End of Session PT - End of Session Equipment Utilized During Treatment: Gait belt Activity Tolerance: Patient tolerated treatment well Patient left: in chair;with call bell/phone within reach;with chair alarm set;with family/visitor present Nurse Communication: Mobility status   GP Functional Assessment Tool Used:  (clinical judgement) Functional Limitation: Mobility: Walking and moving around Mobility: Walking and Moving Around Current Status [phone]): At least 40 percent but less than 60 percent impaired, limited or restricted Mobility: Walking and Moving Around Goal Status [phone]): At least 1 percent but less than 20 percent impaired, limited or restricted   [name] 08/08/2013, 1:01 PM

## 2013-08-08 NOTE — Progress Notes (Signed)
Occupational Therapy Treatment Patient Details Name: Leah Raymond MRN: [id number] DOB: 03-Jun-1931 Today's Date: 08/08/2013 Time: 0109-3235 OT Time Calculation (min): 30 min Self Cares 5732-2025 (7')  OT Assessment / Plan / Recommendation  OT comments  Patient semi-sitting up long sitting in bed with reported improved pain upon arrival.  Patient in agreement for therapy this date and anxious to get out of bed.  Patient seated edge of bed for transfer with increased complaint of right hip pain.  She was mod-max assistance for transfer bed>chair as stand pivot.  Seated up in chair set up for UB bathing, dressing and grooming with setup assistance and increased time.  She also attempted to donn socks with increased difficulty.  Left patient up in chair with all needs in reach , nursing notified of patients transfer and location.     Follow Up Recommendations  SNF;Home health OT    Barriers to Discharge   primary caregiver for her husband    Equipment Recommendations  3 in 1 bedside comode;Other (comment) (donut cushion )    Recommendations for Other Services    Frequency Min 2X/week   Progress towards OT Goals Progress towards OT goals: Progressing toward goals  Plan   Cont with skilled OT services to maximize functional potential/independence.  Recommend patient would benefit from  SNF services prior to discharge to home.    Precautions / Restrictions   falls    ADL  Grooming: Performed;Wash/dry hands;Wash/dry face;Teeth care;Brushing hair;Set up (seated up in chair ) Upper Body Bathing: Performed;Supervision/safety;Set up (seated up in chiar ) Upper Body Dressing: Performed;Minimal assistance (donn hospital gown and back brace ) Lower Body Dressing: Maximal assistance (to donn socks )    OT Diagnosis:    OT Problem List:   OT Treatment Interventions:     OT Goals(current goals can now be found in the care plan section) Acute Rehab OT Goals OT Goal Formulation: With  patient Time For Goal Achievement: 08/21/13 Potential to Achieve Goals: Fair  Visit Information  Last OT Received On: 08/08/13    Subjective Data   I really need to be moving around if I expect to be able to go home.    Prior Functioning       Cognition       Mobility  Bed Mobility Overal bed mobility: Needs Assistance Bed Mobility: Supine to Sit Supine to sit: Min assist;Mod assist (mod verbal cues.) Transfers General transfer comment: mod-max assistance transfer from edge of bed to recliner chair.     End of Session OT - End of Session Equipment Utilized During Treatment: Gait belt Activity Tolerance: Patient tolerated treatment well Patient left: in chair Nurse Communication: Mobility status  GO     Donney Rankins, OTR/L  08/08/2013, 11:02 AM

## 2013-08-08 NOTE — Progress Notes (Signed)
Physical Therapy Treatment Patient Details Name: Leah Raymond MRN: [id number] DOB: Jan 28, 1931 Today's Date: 08/08/2013 Time: 4580-9983 PT Time Calculation (min): 15 min  PT Assessment / Plan / Recommendation  PT Comments   Pt is pleasant and cooperative but easily fatigued. Pt prefers to complete activities without assistance. Walking from recliner to bed appears very taxing for pt. Once in bed pt unable to complete any further therapeutic activities or exercises. Attempted to educated pt in logrolling technique, pt declines. Pt left in supine position with head and BLE slightly elevated with call bell in reach and nursing in room.  Follow Up Recommendations  Home health PT (pt does not have insurance coverage for SNF)     Frequency 7X/week   Progress towards PT Goals Progress towards PT goals: Progressing toward goals  Plan  (Continue per PT POC.)    Precautions / Restrictions Precautions Precautions: Fall Precaution Comments: pt is very osteoporotic Required Braces or Orthoses: Spinal Brace Spinal Brace: Other (comment) Spinal Brace Comments: CASH brace to be worn when OOB Restrictions Weight Bearing Restrictions: No   Pertinent Vitals/Pain 0/10 at rest; Pain increases in right hip/leg with ambulation    Mobility  Bed Mobility Overal bed mobility: Needs Assistance Bed Mobility: Supine to Sit;Sit to Supine Rolling: Supervision Supine to sit: Supervision Sit to supine: Supervision General bed mobility comments: Bed mobility is labored but pt requires only supervision. Once again pt refuses log-rolling techniqes as she beleives it would hurt her legs. Transfers Overall transfer level: Needs assistance Equipment used: Rolling walker (2 wheeled) Transfers: Sit to/from Stand Sit to Stand: Min guard Stand pivot transfers: Min guard General transfer comment: pt was able to complete transfer with no physical assist but this was extremely slow and  labored Ambulation/Gait Ambulation/Gait assistance: Supervision Ambulation Distance (Feet): 14 Feet Assistive device: Rolling walker (2 wheeled) Gait Pattern/deviations: Shuffle;Trunk flexed Gait velocity interpretation: Below normal speed for age/gender General Gait Details: gait with walker is stable    Exercises Other Exercises Other Exercises: [place] Dragon exercises 1-4 were completed with exception of #3 (pt states she cannot do this due to cervical spurs)...written exercise program given     Visit Information  Last PT Received On: 08/08/13    Subjective Data  Pt reprots that she likes to do things without help to maintain indepdence.  Cognition  Cognition Arousal/Alertness: Awake/alert Behavior During Therapy: WFL for tasks assessed/performed Overall Cognitive Status: Within Functional Limits for tasks assessed    Balance  Balance Overall balance assessment: No apparent balance deficits (not formally assessed)  End of Session PT - End of Session Equipment Utilized During Treatment: Gait belt;Back brace Activity Tolerance: Patient tolerated treatment well Patient left: in bed;with call bell/phone within reach;with bed alarm set;with nursing/sitter in room Nurse Communication: Mobility status     Rachelle Hora, PTA  08/08/2013, 2:34 PM

## 2013-08-08 NOTE — Clinical Social Work Note (Signed)
CSW spoke with PT today regarding pt. Pt now says she would like to go to SNF, but does not understand about Medicare. CSW met with pt again and her sister was also present. Pt's sister works as a Theatre stage manager. CSW explained observation status and that without qualifying inpatient 3 day stay, Medicare will not cover SNF. She is aware of approximate up front charges for SNF and states if she has to pay she should just go back home and pay for help there since she will have to anyway for her husband. Pt's best friend has been staying with him. Pt's sister states if she has to she will just quit her job and help them. Pt has list of private duty care agencies and said her friend has started calling as well. CSW will sign off as pt continues to refuse SNF and plans to return home.    Leah Raymond, [place]

## 2013-08-09 LAB — CBC
HEMATOCRIT: 37.5 % (ref 36.0–46.0)
HEMOGLOBIN: 12.7 g/dL (ref 12.0–15.0)
MCH: 32 pg (ref 26.0–34.0)
MCHC: 33.9 g/dL (ref 30.0–36.0)
MCV: 94.5 fL (ref 78.0–100.0)
Platelets: 296 10*3/uL (ref 150–400)
RBC: 3.97 MIL/uL (ref 3.87–5.11)
RDW: 13 % (ref 11.5–15.5)
WBC: 9.4 10*3/uL (ref 4.0–10.5)

## 2013-08-09 LAB — COMPREHENSIVE METABOLIC PANEL
ALK PHOS: 94 U/L (ref 39–117)
ALT: 11 U/L (ref 0–35)
AST: 16 U/L (ref 0–37)
Albumin: 3.2 g/dL — ABNORMAL LOW (ref 3.5–5.2)
BUN: 15 mg/dL (ref 6–23)
CO2: 27 mEq/L (ref 19–32)
Calcium: 9.6 mg/dL (ref 8.4–10.5)
Chloride: 101 mEq/L (ref 96–112)
Creatinine, Ser: 0.63 mg/dL (ref 0.50–1.10)
GFR calc non Af Amer: 82 mL/min — ABNORMAL LOW (ref >90)
GLUCOSE: 101 mg/dL — AB (ref 70–99)
POTASSIUM: 4.5 meq/L (ref 3.7–5.3)
Sodium: 139 mEq/L (ref 137–147)
Total Bilirubin: 0.4 mg/dL (ref 0.3–1.2)
Total Protein: 6.8 g/dL (ref 6.0–8.3)

## 2013-08-09 LAB — VITAMIN D 1,25 DIHYDROXY
VITAMIN D3 1, 25 (OH): 51 pg/mL
Vitamin D 1, 25 (OH)2 Total: 51 pg/mL (ref 18–72)
Vitamin D2 1, 25 (OH)2: <8 pg/mL

## 2013-08-09 MED ORDER — BISACODYL 10 MG RE SUPP: 10.0000 mg | Freq: Every day | RECTAL | Status: DC | PRN

## 2013-08-09 MED ORDER — HYDROCODONE-ACETAMINOPHEN 7.5-325 MG PO TABS: 1.0000 | ORAL_TABLET | ORAL | Status: DC | PRN

## 2013-08-09 NOTE — Progress Notes (Signed)
Patient with orders to be discharge home. Discharge instruction given to patient, verbalized understanding. Patient stable. Patient left in private vehicle with a friend.

## 2013-08-09 NOTE — Progress Notes (Signed)
Physical Therapy Treatment Patient Details Name: Leah Raymond MRN: [id number] DOB: March 02, 1931 Today's Date: 08/09/2013 Time: 4098-1191 PT Time Calculation (min): 25 min  PT Assessment / Plan / Recommendation  History of Present Illness     PT Comments   Pt pleasant and eager to participate.  Pt able to demonstrate safe mechanics with sit to stand, multiple attempts with proper hand placement used with no cueing required.  Increased distance with gait training with SBA with stabile gait mechanics.  Pt left in chair with chair alarm set, call bell within reach and nursing in room.  No reports of pain through session.    Follow Up Recommendations        Does the patient have the potential to tolerate intense rehabilitation     Barriers to Discharge        Equipment Recommendations       Recommendations for Other Services    Frequency     Progress towards PT Goals Progress towards PT goals: Progressing toward goals  Plan      Precautions / Restrictions Precautions Precautions: Fall Precaution Comments: pt is very osteoporotic Required Braces or Orthoses: Spinal Brace Spinal Brace: Other (comment) Spinal Brace Comments: CASH brace to be worn when OOB Restrictions Weight Bearing Restrictions: No    Mobility  Transfers Equipment used: Rolling walker (2 wheeled) Transfers: Sit to/from Stand Sit to Stand: Min guard General transfer comment: pt was able to complete transfer with no physical assist but this was extremely slow and labored; completed 3 sit to stands independently with good mechanics and no cueing required  Ambulation/Gait Ambulation/Gait assistance: Supervision Ambulation Distance (Feet): 40 Feet Assistive device: Rolling walker (2 wheeled) Gait Pattern/deviations: Decreased stride length;Trunk flexed;Shuffle General Gait Details: gait with walker is stable    Exercises     PT Diagnosis:    PT Problem List:   PT Treatment Interventions:     PT Goals  (current goals can now be found in the care plan section)    Visit Information  Last PT Received On: 08/09/13    Subjective Data   Pt excited to go home, stated she was going to push it with her therapy today, wants to improve for return home.  Pain free.   Cognition  Cognition Arousal/Alertness: Awake/alert Behavior During Therapy: WFL for tasks assessed/performed Overall Cognitive Status: Within Functional Limits for tasks assessed    Balance     End of Session PT - End of Session Equipment Utilized During Treatment: Gait belt Activity Tolerance: Patient tolerated treatment well Patient left: in chair;with call bell/phone within reach;with chair alarm set;with nursing/sitter in room Nurse Communication: Mobility status   GP     Aldona Lento 08/09/2013, 9:18 AM

## 2013-08-10 NOTE — Discharge Summary (Signed)
NAMEDIAHN, WAIDELICH                  ACCOUNT NO.:  [id number]  MEDICAL RECORD NO.:  [id number]  LOCATION:  A341                          FACILITY:  [facility]  PHYSICIAN:  Estill Bamberg. Karie Kirks, M.D.DATE OF BIRTH:  Feb 18, 1931  DATE OF ADMISSION:  08/06/2013 DATE OF DISCHARGE:  02/13/2015LH                              DISCHARGE SUMMARY   HISTORY OF PRESENT ILLNESS:  This 80 year old woman was admitted to the hospital with a lumbar compression fracture.  She had a fairly benign 3 day hospitalization extending from February 11 to August 09, 2013. Her vital signs remained stable.  Her admission CBC, CMP, and differential were normal.  MRSA was negative by PCR.  The right hip x-ray was negative.  The lumbar spine showed severe L1 and L2 compression fractures, with the L2 fracture new since 2010.  She also had mild L3 and L4 compression fractures, not previously imaged.  She had osteopenia.  CT scan of the lumbar region showed "compression fractures at every level of the lumbar spine."  It was felt that of these fractures, the L3 compression fracture was acute or subacute.  In the hospital, she was treated with hydrocodone/APAP for severe pain, amlodipine 5 mg daily, amitriptyline 10 mg at bedtime, and temazepam at bedtime for sleep.  She was seen by Physical Therapy.  She gradually improved to the point she could go home the third day. Arrangements were made for home health to help with home and physical therapy to be there as well.  She will use bisacodyl suppositories at home for constipation if this is an issue.  She is to use hydrocodone/APAP 7.5/325 up to 4 times a day, versus her 3 times a day prior to hospitalization.  She is to continue on alprazolam 0.5 mg b.i.d., amlodipine 5 mg daily, multivitamins daily, and temazepam at bedtime at 30 mg.  She already has a prescription for amitriptyline 10 mg to use 3 at night, which has helped her stress in the past.  FINAL DISCHARGE  DIAGNOSES: 1. Acute lumbar compression fracture. 2. Severe osteoporosis. 3. Hypertension. 4. Family stresses to the husband with dementia.  I will see her in followup within a month.     Estill Bamberg. Karie Kirks, M.D.     SDK/MEDQ  D:  08/09/2013  T:  08/10/2013  Job:  [id number]

## 2014-01-31 ENCOUNTER — Other Ambulatory Visit (HOSPITAL_COMMUNITY): Payer: Self-pay | Admitting: Orthopaedic Surgery

## 2014-01-31 DIAGNOSIS — Z853 Personal history of malignant neoplasm of breast: Secondary | ICD-10-CM

## 2014-01-31 DIAGNOSIS — M546 Pain in thoracic spine: Secondary | ICD-10-CM

## 2014-02-05 ENCOUNTER — Ambulatory Visit (HOSPITAL_COMMUNITY)
Admission: RE | Admit: 2014-02-05 | Discharge: 2014-02-05 | Disposition: A | Discharge status: Home / Self Care | Payer: Medicare Other | Source: Ambulatory Visit | Attending: Orthopaedic Surgery | Admitting: Orthopaedic Surgery

## 2014-02-05 ENCOUNTER — Encounter (HOSPITAL_COMMUNITY): Payer: Self-pay | Admitting: Emergency Medicine

## 2014-02-05 ENCOUNTER — Emergency Department (HOSPITAL_COMMUNITY)
Admission: EM | Admit: 2014-02-05 | Discharge: 2014-02-05 | Disposition: A | Discharge status: Home / Self Care | Payer: Medicare Other | Attending: Emergency Medicine | Admitting: Emergency Medicine

## 2014-02-05 DIAGNOSIS — Z8781 Personal history of (healed) traumatic fracture: Secondary | ICD-10-CM | POA: Insufficient documentation

## 2014-02-05 DIAGNOSIS — X58XXXA Exposure to other specified factors, initial encounter: Secondary | ICD-10-CM | POA: Insufficient documentation

## 2014-02-05 DIAGNOSIS — R55 Syncope and collapse: Secondary | ICD-10-CM | POA: Insufficient documentation

## 2014-02-05 DIAGNOSIS — R404 Transient alteration of awareness: Secondary | ICD-10-CM | POA: Insufficient documentation

## 2014-02-05 DIAGNOSIS — Z853 Personal history of malignant neoplasm of breast: Secondary | ICD-10-CM

## 2014-02-05 DIAGNOSIS — R1900 Intra-abdominal and pelvic swelling, mass and lump, unspecified site: Secondary | ICD-10-CM | POA: Diagnosis not present

## 2014-02-05 DIAGNOSIS — S32009A Unspecified fracture of unspecified lumbar vertebra, initial encounter for closed fracture: Secondary | ICD-10-CM | POA: Insufficient documentation

## 2014-02-05 DIAGNOSIS — S22009A Unspecified fracture of unspecified thoracic vertebra, initial encounter for closed fracture: Secondary | ICD-10-CM | POA: Diagnosis not present

## 2014-02-05 DIAGNOSIS — Z79899 Other long term (current) drug therapy: Secondary | ICD-10-CM | POA: Diagnosis not present

## 2014-02-05 DIAGNOSIS — M549 Dorsalgia, unspecified: Secondary | ICD-10-CM | POA: Diagnosis not present

## 2014-02-05 DIAGNOSIS — M546 Pain in thoracic spine: Secondary | ICD-10-CM | POA: Diagnosis present

## 2014-02-05 DIAGNOSIS — Z880 Allergy status to penicillin: Secondary | ICD-10-CM | POA: Insufficient documentation

## 2014-02-05 DIAGNOSIS — Z859 Personal history of malignant neoplasm, unspecified: Secondary | ICD-10-CM | POA: Insufficient documentation

## 2014-02-05 LAB — URINALYSIS, ROUTINE W REFLEX MICROSCOPIC
Bilirubin Urine: NEGATIVE
GLUCOSE, UA: NEGATIVE mg/dL
HGB URINE DIPSTICK: NEGATIVE
Ketones, ur: NEGATIVE mg/dL
Leukocytes, UA: NEGATIVE
Nitrite: NEGATIVE
PH: 7.5 (ref 5.0–8.0)
PROTEIN: NEGATIVE mg/dL
SPECIFIC GRAVITY, URINE: 1.01 (ref 1.005–1.030)
Urobilinogen, UA: 0.2 mg/dL (ref 0.0–1.0)

## 2014-02-05 LAB — CBC WITH DIFFERENTIAL/PLATELET
Basophils Absolute: 0 10*3/uL (ref 0.0–0.1)
Basophils Relative: 1 % (ref 0–1)
EOS PCT: 5 % (ref 0–5)
Eosinophils Absolute: 0.4 10*3/uL (ref 0.0–0.7)
HEMATOCRIT: 40.1 % (ref 36.0–46.0)
Hemoglobin: 13.5 g/dL (ref 12.0–15.0)
LYMPHS ABS: 1.2 10*3/uL (ref 0.7–4.0)
LYMPHS PCT: 16 % (ref 12–46)
MCH: 31.1 pg (ref 26.0–34.0)
MCHC: 33.7 g/dL (ref 30.0–36.0)
MCV: 92.4 fL (ref 78.0–100.0)
MONO ABS: 0.4 10*3/uL (ref 0.1–1.0)
Monocytes Relative: 6 % (ref 3–12)
NEUTROS ABS: 5.5 10*3/uL (ref 1.7–7.7)
Neutrophils Relative %: 72 % (ref 43–77)
Platelets: 327 10*3/uL (ref 150–400)
RBC: 4.34 MIL/uL (ref 3.87–5.11)
RDW: 13.8 % (ref 11.5–15.5)
WBC: 7.6 10*3/uL (ref 4.0–10.5)

## 2014-02-05 LAB — BASIC METABOLIC PANEL
Anion gap: 13 (ref 5–15)
BUN: 11 mg/dL (ref 6–23)
CO2: 28 meq/L (ref 19–32)
Calcium: 9.4 mg/dL (ref 8.4–10.5)
Chloride: 98 mEq/L (ref 96–112)
Creatinine, Ser: 0.62 mg/dL (ref 0.50–1.10)
GFR calc Af Amer: >90 mL/min (ref >90)
GFR calc non Af Amer: 82 mL/min — ABNORMAL LOW (ref >90)
Glucose, Bld: 110 mg/dL — ABNORMAL HIGH (ref 70–99)
POTASSIUM: 3.8 meq/L (ref 3.7–5.3)
SODIUM: 139 meq/L (ref 137–147)

## 2014-02-05 LAB — TROPONIN I
Troponin I: <0.3 ng/mL (ref <0.30)
Troponin I: <0.3 ng/mL (ref <0.30)

## 2014-02-05 LAB — POCT I-STAT CREATININE: Creatinine, Ser: 0.7 mg/dL (ref 0.50–1.10)

## 2014-02-05 MED ORDER — GADOBENATE DIMEGLUMINE 529 MG/ML IV SOLN
7.0000 mL | Freq: Once | INTRAVENOUS | Status: AC | PRN
  Administered 2014-02-05: 7 mL via INTRAVENOUS

## 2014-02-05 NOTE — ED Notes (Signed)
Pt in MRI, was placed on a bedpan and had a syncopal episode. Pt brought to the ED for evaluation. Pt denies any pain but states she fells nauseated

## 2014-02-05 NOTE — ED Notes (Signed)
Patient given discharge instruction, verbalized understand. IV removed, band aid applied. Patient ambulatory out of the department.  

## 2014-02-05 NOTE — Discharge Instructions (Signed)
Syncope °Syncope is a medical term for fainting or passing out. This means you lose consciousness and drop to the ground. People are generally unconscious for less than 5 minutes. You may have some muscle twitches for up to 15 seconds before waking up and returning to normal. Syncope occurs more often in older adults, but it can happen to anyone. While most causes of syncope are not dangerous, syncope can be a sign of a serious medical problem. It is important to seek medical care.  °CAUSES  °Syncope is caused by a sudden drop in blood flow to the brain. The specific cause is often not determined. Factors that can bring on syncope include: °· Taking medicines that lower blood pressure. °· Sudden changes in posture, such as standing up quickly. °· Taking more medicine than prescribed. °· Standing in one place for too long. °· Seizure disorders. °· Dehydration and excessive exposure to heat. °· Low blood sugar (hypoglycemia). °· Straining to have a bowel movement. °· Heart disease, irregular heartbeat, or other circulatory problems. °· Fear, emotional distress, seeing blood, or severe pain. °SYMPTOMS  °Right before fainting, you may: °· Feel dizzy or light-headed. °· Feel nauseous. °· See all white or all black in your field of vision. °· Have cold, clammy skin. °DIAGNOSIS  °Your health care provider will ask about your symptoms, perform a physical exam, and perform an electrocardiogram (ECG) to record the electrical activity of your heart. Your health care provider may also perform other heart or blood tests to determine the cause of your syncope which may include: °· Transthoracic echocardiogram (TTE). During echocardiography, sound waves are used to evaluate how blood flows through your heart. °· Transesophageal echocardiogram (TEE). °· Cardiac monitoring. This allows your health care provider to monitor your heart rate and rhythm in real time. °· Holter monitor. This is a portable device that records your  heartbeat and can help diagnose heart arrhythmias. It allows your health care provider to track your heart activity for several days, if needed. °· Stress tests by exercise or by giving medicine that makes the heart beat faster. °TREATMENT  °In most cases, no treatment is needed. Depending on the cause of your syncope, your health care provider may recommend changing or stopping some of your medicines. °HOME CARE INSTRUCTIONS °· Have someone stay with you until you feel stable. °· Do not drive, use machinery, or play sports until your health care provider says it is okay. °· Keep all follow-up appointments as directed by your health care provider. °· Lie down right away if you start feeling like you might faint. Breathe deeply and steadily. Wait until all the symptoms have passed. °· Drink enough fluids to keep your urine clear or pale yellow. °· If you are taking blood pressure or heart medicine, get up slowly and take several minutes to sit and then stand. This can reduce dizziness. °SEEK IMMEDIATE MEDICAL CARE IF:  °· You have a severe headache. °· You have unusual pain in the chest, abdomen, or back. °· You are bleeding from your mouth or rectum, or you have black or tarry stool. °· You have an irregular or very fast heartbeat. °· You have pain with breathing. °· You have repeated fainting or seizure-like jerking during an episode. °· You faint when sitting or lying down. °· You have confusion. °· You have trouble walking. °· You have severe weakness. °· You have vision problems. °If you fainted, call your local emergency services (911 in [place] Do not drive   yourself to the hospital.  °MAKE SURE YOU: °· Understand these instructions. °· Will watch your condition. °· Will get help right away if you are not doing well or get worse. °Document Released: 06/13/2005 Document Revised: 06/18/2013 Document Reviewed: 08/12/2011 °[employer]® Patient Information ©2015 [employer]. This information is not intended to replace  advice given to you by your health care provider. Make sure you discuss any questions you have with your health care provider. ° °

## 2014-02-05 NOTE — ED Provider Notes (Signed)
CSN: [id number]     Arrival date & time 02/05/14  1649 History   First MD Initiated Contact with Patient 02/05/14 1653     Chief Complaint  Patient presents with  . Loss of Consciousness     (Consider location/radiation/quality/duration/timing/severity/associated sxs/prior Treatment) Patient is a 80 y.o. female presenting with syncope. The history is provided by the patient.  Loss of Consciousness Episode history:  Single Associated symptoms: no chest pain, no headaches, no nausea, no shortness of breath, no vomiting and no weakness    patient was an MRI after getting a contrast MRI of her thoracic spine. After the MRI she was placed on the bedpan she states she had to urinate severely. She states that they only let her go half as much as needed 2. She then began to feel lightheaded and reportedly passed out. He was followed by some confusion. No seizure activity. No chest pain. No trouble breathing patient is feeling somewhat better now states she just wants to go home.  Past Medical History  Diagnosis Date  . Osteoporosis   . Spinal fracture   . Hypertension   . Cancer    Past Surgical History  Procedure Laterality Date  . Abdominal hysterectomy    . Cholecystectomy    . Hip arthroplasty    . Mastectomy     Family History  Problem Relation Age of Onset  . Colon cancer Mother   . Liver cancer Father    History  Substance Use Topics  . Smoking status: Never Smoker   . Smokeless tobacco: Not on file  . Alcohol Use: No   OB History   Grav Para Term Preterm Abortions TAB SAB Ect Mult Living                 Review of Systems  Constitutional: Negative for activity change and appetite change.  Eyes: Negative for pain.  Respiratory: Negative for chest tightness and shortness of breath.   Cardiovascular: Positive for syncope. Negative for chest pain and leg swelling.  Gastrointestinal: Negative for nausea, vomiting, abdominal pain and diarrhea.  Genitourinary: Negative for  flank pain.  Musculoskeletal: Positive for back pain. Negative for neck stiffness.  Skin: Negative for rash.  Neurological: Positive for syncope. Negative for weakness, numbness and headaches.  Psychiatric/Behavioral: Negative for behavioral problems.      Allergies  Penicillins and Morphine and related  Home Medications   Prior to Admission medications   Medication Sig Start Date End Date Taking? Authorizing Provider  amitriptyline (ELAVIL) 10 MG tablet Take 10 mg by mouth at bedtime as needed for sleep.   Yes Historical Provider, MD  amLODipine (NORVASC) 5 MG tablet Take 5 mg by mouth daily.     Yes Historical Provider, MD  Calcium Carb-Cholecalciferol (CALCIUM 500+D) 500-400 MG-UNIT TABS Take 1 tablet by mouth daily.   Yes Historical Provider, MD  docusate sodium (COLACE) 100 MG capsule Take 100 mg by mouth daily as needed. constipation   Yes Historical Provider, MD  Multiple Vitamin (MULTIVITAMIN) capsule Take 1 capsule by mouth daily.     Yes Historical Provider, MD  OXYCONTIN 10 MG T12A 12 hr tablet Take 10 mg by mouth every 12 (twelve) hours. pain 01/20/14  Yes Historical Provider, MD  temazepam (RESTORIL) 30 MG capsule Take 30 mg by mouth at bedtime as needed for sleep.   Yes Historical Provider, MD  VOLTAREN 1 % GEL Apply 1 application topically 3 (three) times daily as needed. pain 01/13/14  Yes Historical  Provider, MD  furosemide (LASIX) 20 MG tablet Take 20 mg by mouth daily as needed. swelling 12/02/13   Historical Provider, MD  tiZANidine (ZANAFLEX) 2 MG tablet Take 2 mg by mouth at bedtime as needed for muscle spasms.  11/08/13   Historical Provider, MD   BP 150/83  Pulse 92  Temp(Src) 97.6 F (36.4 C) (Oral)  Resp 21  SpO2 95% Physical Exam  Nursing note and vitals reviewed. Constitutional: She is oriented to person, place, and time. She appears well-developed and well-nourished.  HENT:  Head: Normocephalic and atraumatic.  Eyes: EOM are normal. Pupils are equal,  round, and reactive to light.  Neck: Normal range of motion. Neck supple.  Cardiovascular: Normal rate, regular rhythm and normal heart sounds.   No murmur heard. Pulmonary/Chest: Effort normal and breath sounds normal. No respiratory distress. She has no wheezes. She has no rales.  Abdominal: Soft. She exhibits mass. She exhibits no distension. There is tenderness. There is no rebound and no guarding.  Suprapubic mass up to near umbilicus  Musculoskeletal: Normal range of motion.  Neurological: She is alert and oriented to person, place, and time. No cranial nerve deficit.  Good grip strength bilaterally. Moves bilateral lower extremities.  Skin: Skin is warm and dry.  Psychiatric: She has a normal mood and affect. Her speech is normal.    ED Course  Procedures (including critical care time) Labs Review Labs Reviewed  BASIC METABOLIC PANEL - Abnormal; Notable for the following:    Glucose, Bld 110 (*)    GFR calc non Af Amer 82 (*)    All other components within normal limits  URINALYSIS, ROUTINE W REFLEX MICROSCOPIC  CBC WITH DIFFERENTIAL  TROPONIN I  TROPONIN I    Imaging Review Mr Thoracic Spine W Wo Contrast  02/05/2014   CLINICAL DATA:  Pain throughout the spine. The pain increases with movement. Bilateral rib pain. Personal history of breast cancer.  EXAM: MRI THORACIC SPINE WITHOUT AND WITH CONTRAST  TECHNIQUE: Multiplanar and multiecho pulse sequences of the thoracic spine were obtained without and with intravenous contrast.  CONTRAST:  60mL MULTIHANCE GADOBENATE DIMEGLUMINE 529 MG/ML IV SOLN  COMPARISON:  None.  FINDINGS: Normal signal is present in the thoracic spine. The conus medullaris terminates at L2-3, the lower limits of normal.  Remote superior endplate fractures are present at T12, L1, and L2. There is significant retropulsion of bone at L2, potentially contacting the ventral surface of the cord. More mild retropulsion is noted along the superior endplate of L1.  Remote compression fractures are noted at L4 and L5 as well. The remote compression fracture is present at T5. There is a vertebral plana fracture at T6 and T8. There is some residual edema in T1 signal loss within the anterior aspect of the T6 fracture. A T7 fracture demonstrates approximately 40% loss of height compared to the expected height. This fracture is non healed with diffuse marrow edema enhancement.  There is some heterogeneity throughout the marrow. Focal enhancement is present within the right pedicle of T11. There is mild apparent enhancement in the posterior aspects of T12, L1, L2, L3, and L4. No focal enhancement is present in the upper thoracic spine.  Bilateral foraminal narrowing is worse right than left at T5-6, T6-7, T7-8, and T8-9. Foraminal narrowing is present bilaterally at L1-2 and L2-3 is well.  IMPRESSION: 1. Compression fractures at T5, T6, T7, and T8 with exaggerated thoracic kyphosis. 2. The fracture at T7 demonstrates 40% loss of height  with diffuse marrow edema, compatible with a non healed fracture. 3. There is some residual edema within the anterior aspect of a vertebral plana compression fracture at T6. 4. The vertebra plana fracture at T8 is remote. 5. Remote fractures at T12, L1, L2, L4, and L5. 6. Multilevel foraminal stenosis as described. This may account for the rib pain. 7. No discrete rib lesions are present although there is significant patient motion on the axial images, particularly the postcontrast axial images. 8. Focal enhancement within the right posterior pedicle of T11. This raises concern for metastatic disease. 9. Mild scattered enhancement as described above may reflect other foci of enhancement versus reactive change. 10. The compression fractures appear osteoporotic rather than pathologic.   Electronically Signed   By: Lawrence Santiago M.D.   On: 02/05/2014 19:27     EKG Interpretation None      MDM   Final diagnoses:  Vasovagal syncope     Patient with syncope. After MRI. Likely due to a vagal event since he was not able to urinate. Patient feels much better after emptying the bladder. Scan shows multiple compression fractures without cord compression. EKG showed mildly peaked T waves laterally. Enzymes negative x2 patient was discharged home at her baseline    Jasper Riling. Alvino Chapel, MD 02/05/14 2136

## 2014-02-05 NOTE — ED Notes (Signed)
Pt. Arrived to ER, talking, alert and oriented x4, asking to go home, states she really had to urinate during MRI, in and out cath performed, output 525, pt. States she feels much better.

## 2014-02-05 NOTE — ED Notes (Signed)
Called lab about delay in lab work, Joycelyn Schmid said BMP and troponin should be resulted in the next few minutes.

## 2014-03-05 ENCOUNTER — Other Ambulatory Visit (HOSPITAL_COMMUNITY): Payer: Medicare Other

## 2014-03-05 ENCOUNTER — Inpatient Hospital Stay
Admission: RE | Admit: 2014-03-05 | Discharge: 2014-03-07 | Disposition: A | Discharge status: Skilled Nursing Facility | Payer: Medicare Other | Source: Ambulatory Visit | Attending: Internal Medicine | Admitting: Internal Medicine

## 2014-03-05 ENCOUNTER — Ambulatory Visit (HOSPITAL_COMMUNITY)
Admission: AD | Admit: 2014-03-05 | Discharge: 2014-03-05 | Disposition: A | Discharge status: Home / Self Care | Payer: Medicare Other | Source: Other Acute Inpatient Hospital | Attending: Nurse Practitioner | Admitting: Nurse Practitioner

## 2014-03-05 DIAGNOSIS — J9600 Acute respiratory failure, unspecified whether with hypoxia or hypercapnia: Secondary | ICD-10-CM | POA: Insufficient documentation

## 2014-03-05 DIAGNOSIS — J189 Pneumonia, unspecified organism: Secondary | ICD-10-CM | POA: Diagnosis not present

## 2014-03-06 LAB — CBC WITH DIFFERENTIAL/PLATELET
Basophils Absolute: 0.1 10*3/uL (ref 0.0–0.1)
Basophils Relative: 1 % (ref 0–1)
Eosinophils Absolute: 0.5 10*3/uL (ref 0.0–0.7)
Eosinophils Relative: 7 % — ABNORMAL HIGH (ref 0–5)
HCT: 29.4 % — ABNORMAL LOW (ref 36.0–46.0)
HEMOGLOBIN: 9.6 g/dL — AB (ref 12.0–15.0)
LYMPHS ABS: 0.9 10*3/uL (ref 0.7–4.0)
Lymphocytes Relative: 12 % (ref 12–46)
MCH: 29.5 pg (ref 26.0–34.0)
MCHC: 32.7 g/dL (ref 30.0–36.0)
MCV: 90.5 fL (ref 78.0–100.0)
MONOS PCT: 8 % (ref 3–12)
Monocytes Absolute: 0.6 10*3/uL (ref 0.1–1.0)
NEUTROS ABS: 5.5 10*3/uL (ref 1.7–7.7)
NEUTROS PCT: 72 % (ref 43–77)
Platelets: 546 10*3/uL — ABNORMAL HIGH (ref 150–400)
RBC: 3.25 MIL/uL — AB (ref 3.87–5.11)
RDW: 15 % (ref 11.5–15.5)
WBC: 7.6 10*3/uL (ref 4.0–10.5)

## 2014-03-06 LAB — COMPREHENSIVE METABOLIC PANEL
ALBUMIN: 1.8 g/dL — AB (ref 3.5–5.2)
ALK PHOS: 80 U/L (ref 39–117)
ALT: 14 U/L (ref 0–35)
ANION GAP: 6 (ref 5–15)
AST: 24 U/L (ref 0–37)
BUN: 6 mg/dL (ref 6–23)
CHLORIDE: 103 meq/L (ref 96–112)
CO2: 30 mEq/L (ref 19–32)
Calcium: 8 mg/dL — ABNORMAL LOW (ref 8.4–10.5)
Creatinine, Ser: 0.52 mg/dL (ref 0.50–1.10)
GFR calc non Af Amer: 87 mL/min — ABNORMAL LOW (ref >90)
GLUCOSE: 92 mg/dL (ref 70–99)
Potassium: 3.8 mEq/L (ref 3.7–5.3)
Sodium: 139 mEq/L (ref 137–147)
Total Bilirubin: 0.3 mg/dL (ref 0.3–1.2)
Total Protein: 4.7 g/dL — ABNORMAL LOW (ref 6.0–8.3)

## 2014-03-06 LAB — VITAMIN B12: Vitamin B-12: 1184 pg/mL — ABNORMAL HIGH (ref 211–911)

## 2014-03-06 LAB — TSH: TSH: 3.4 u[IU]/mL (ref 0.350–4.500)

## 2014-03-06 LAB — PREALBUMIN: Prealbumin: 12.9 mg/dL — ABNORMAL LOW (ref 17.0–34.0)

## 2014-03-06 LAB — T4, FREE: Free T4: 1.31 ng/dL (ref 0.80–1.80)

## 2014-03-06 LAB — FOLATE RBC: RBC Folate: 1247 ng/mL — ABNORMAL HIGH (ref >280)

## 2014-03-06 LAB — PROCALCITONIN: Procalcitonin: <0.1 ng/mL

## 2014-03-06 LAB — VANCOMYCIN, TROUGH: VANCOMYCIN TR: 8.4 ug/mL — AB (ref 10.0–20.0)

## 2014-03-07 LAB — MAGNESIUM: MAGNESIUM: 1.9 mg/dL (ref 1.5–2.5)

## 2014-03-07 LAB — CBC WITH DIFFERENTIAL/PLATELET
BASOS ABS: 0.1 10*3/uL (ref 0.0–0.1)
Basophils Relative: 1 % (ref 0–1)
Eosinophils Absolute: 0.6 10*3/uL (ref 0.0–0.7)
Eosinophils Relative: 8 % — ABNORMAL HIGH (ref 0–5)
HEMATOCRIT: 34.3 % — AB (ref 36.0–46.0)
Hemoglobin: 11 g/dL — ABNORMAL LOW (ref 12.0–15.0)
LYMPHS PCT: 14 % (ref 12–46)
Lymphs Abs: 1.1 10*3/uL (ref 0.7–4.0)
MCH: 29.5 pg (ref 26.0–34.0)
MCHC: 32.1 g/dL (ref 30.0–36.0)
MCV: 92 fL (ref 78.0–100.0)
MONO ABS: 0.8 10*3/uL (ref 0.1–1.0)
Monocytes Relative: 10 % (ref 3–12)
Neutro Abs: 5.3 10*3/uL (ref 1.7–7.7)
Neutrophils Relative %: 67 % (ref 43–77)
Platelets: 564 10*3/uL — ABNORMAL HIGH (ref 150–400)
RBC: 3.73 MIL/uL — ABNORMAL LOW (ref 3.87–5.11)
RDW: 15.3 % (ref 11.5–15.5)
WBC: 7.8 10*3/uL (ref 4.0–10.5)

## 2014-03-07 LAB — BASIC METABOLIC PANEL
ANION GAP: 9 (ref 5–15)
BUN: 10 mg/dL (ref 6–23)
CO2: 30 meq/L (ref 19–32)
CREATININE: 0.6 mg/dL (ref 0.50–1.10)
Calcium: 8.6 mg/dL (ref 8.4–10.5)
Chloride: 104 mEq/L (ref 96–112)
GFR calc Af Amer: >90 mL/min (ref >90)
GFR calc non Af Amer: 83 mL/min — ABNORMAL LOW (ref >90)
Glucose, Bld: 93 mg/dL (ref 70–99)
Potassium: 4.1 mEq/L (ref 3.7–5.3)
SODIUM: 143 meq/L (ref 137–147)

## 2014-03-07 LAB — PHOSPHORUS: Phosphorus: 3.8 mg/dL (ref 2.3–4.6)

## 2015-08-26 DEATH — deceased
